# Patient Record
Sex: Female | Born: 1975 | Race: Black or African American | Hispanic: No | Marital: Married | Smoking: Never smoker
Health system: Southern US, Community
[De-identification: ages and names within clinical notes are randomized; demographics above are authoritative.]

## PROBLEM LIST (undated history)

## (undated) DIAGNOSIS — E0590 Thyrotoxicosis, unspecified without thyrotoxic crisis or storm: Secondary | ICD-10-CM

## (undated) DIAGNOSIS — I10 Essential (primary) hypertension: Secondary | ICD-10-CM

## (undated) HISTORY — PX: OVARIAN CYST REMOVAL: SHX89

## (undated) HISTORY — PX: TOOTH EXTRACTION: SUR596

## (undated) HISTORY — DX: Essential (primary) hypertension: I10

---

## 1998-05-05 ENCOUNTER — Emergency Department (HOSPITAL_COMMUNITY): Admission: EM | Admit: 1998-05-05 | Discharge: 1998-05-05 | Payer: Self-pay | Admitting: Emergency Medicine

## 1998-11-08 ENCOUNTER — Other Ambulatory Visit: Admission: RE | Admit: 1998-11-08 | Discharge: 1998-11-08 | Payer: Self-pay | Admitting: *Deleted

## 1999-04-22 ENCOUNTER — Emergency Department (HOSPITAL_COMMUNITY): Admission: EM | Admit: 1999-04-22 | Discharge: 1999-04-22 | Payer: Self-pay | Admitting: Emergency Medicine

## 1999-11-17 ENCOUNTER — Other Ambulatory Visit: Admission: RE | Admit: 1999-11-17 | Discharge: 1999-11-17 | Payer: Self-pay | Admitting: *Deleted

## 2000-03-28 ENCOUNTER — Emergency Department (HOSPITAL_COMMUNITY): Admission: EM | Admit: 2000-03-28 | Discharge: 2000-03-28 | Payer: Self-pay | Admitting: Emergency Medicine

## 2000-09-29 ENCOUNTER — Inpatient Hospital Stay (HOSPITAL_COMMUNITY): Admission: AD | Admit: 2000-09-29 | Discharge: 2000-09-29 | Payer: Self-pay | Admitting: Obstetrics and Gynecology

## 2000-10-16 ENCOUNTER — Other Ambulatory Visit: Admission: RE | Admit: 2000-10-16 | Discharge: 2000-10-16 | Payer: Self-pay | Admitting: *Deleted

## 2001-02-13 ENCOUNTER — Inpatient Hospital Stay (HOSPITAL_COMMUNITY): Admission: AD | Admit: 2001-02-13 | Discharge: 2001-02-22 | Payer: Self-pay | Admitting: Obstetrics and Gynecology

## 2001-02-13 ENCOUNTER — Encounter: Payer: Self-pay | Admitting: Obstetrics and Gynecology

## 2001-02-13 ENCOUNTER — Encounter (INDEPENDENT_AMBULATORY_CARE_PROVIDER_SITE_OTHER): Payer: Self-pay | Admitting: Specialist

## 2001-02-18 ENCOUNTER — Encounter: Payer: Self-pay | Admitting: Obstetrics and Gynecology

## 2001-03-26 ENCOUNTER — Other Ambulatory Visit: Admission: RE | Admit: 2001-03-26 | Discharge: 2001-03-26 | Payer: Self-pay | Admitting: *Deleted

## 2004-03-21 ENCOUNTER — Inpatient Hospital Stay (HOSPITAL_COMMUNITY): Admission: AD | Admit: 2004-03-21 | Discharge: 2004-06-07 | Payer: Self-pay | Admitting: Obstetrics & Gynecology

## 2004-03-22 ENCOUNTER — Ambulatory Visit: Payer: Self-pay | Admitting: Neonatology

## 2004-06-05 ENCOUNTER — Encounter (INDEPENDENT_AMBULATORY_CARE_PROVIDER_SITE_OTHER): Payer: Self-pay | Admitting: *Deleted

## 2004-07-21 ENCOUNTER — Ambulatory Visit (HOSPITAL_COMMUNITY): Admission: RE | Admit: 2004-07-21 | Discharge: 2004-07-21 | Payer: Self-pay | Admitting: Obstetrics & Gynecology

## 2006-06-03 IMAGING — US US OB COMP +14 WK
1 series · 13 of 21 positions shown · non-contrast
Comparison: none

CLINICAL DATA: 24 week 5 day assigned gestational age.  Evaluate fetal growth.

[Series 1: us ob comp +14 wk · 0.29mm/px · 13 of 21 slices shown]
[im 1/21]
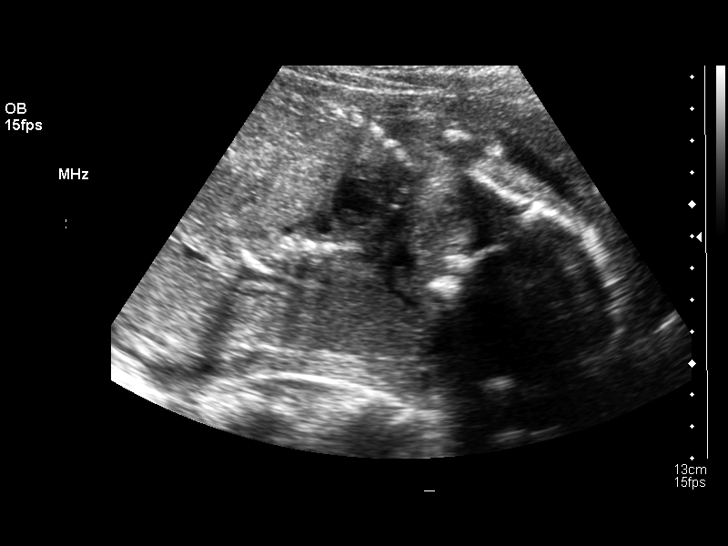
[im 3/21]
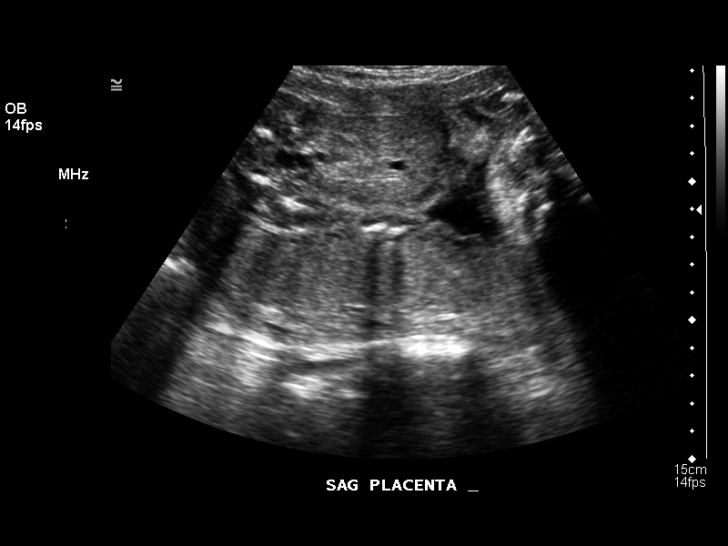
[im 5/21]
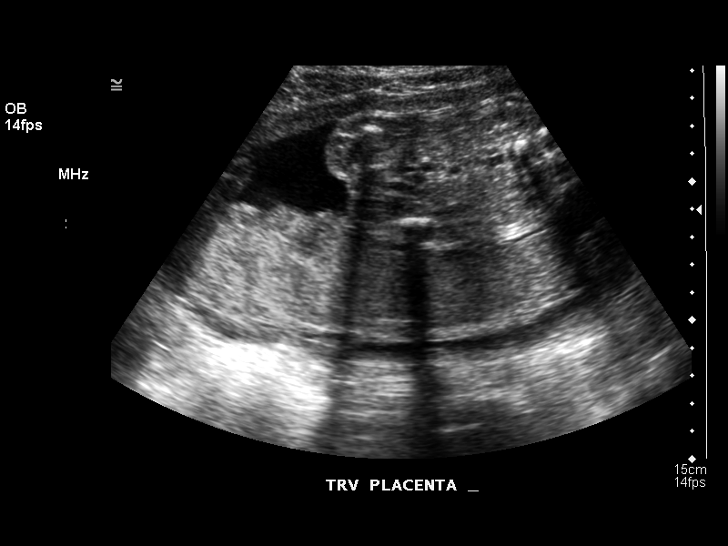
[im 6/21]
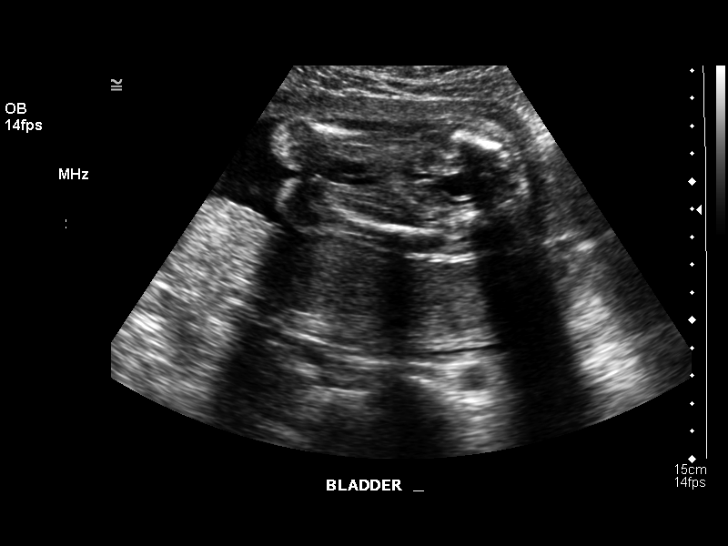
[im 8/21]
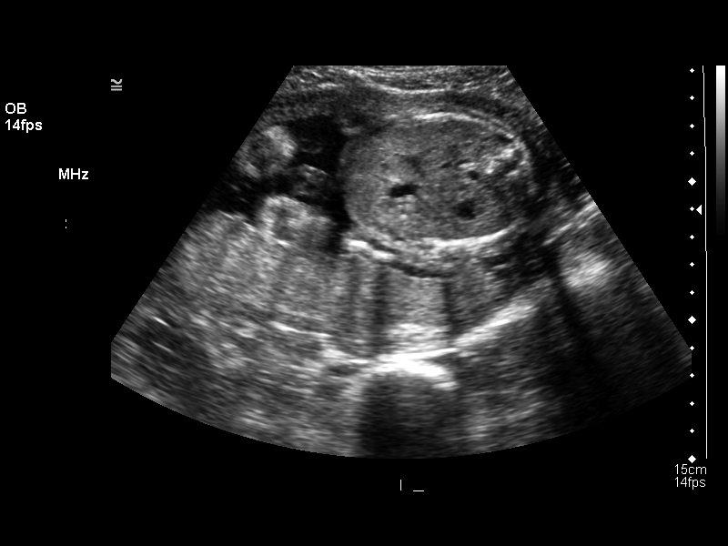
[im 9/21]
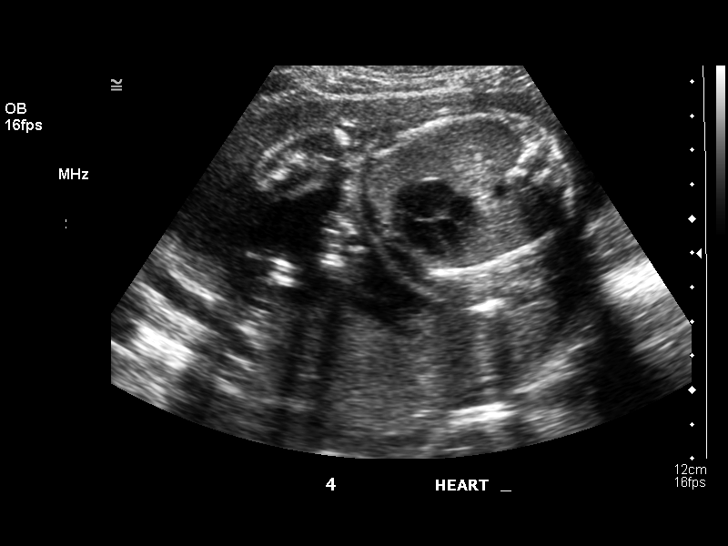
[im 11/21]
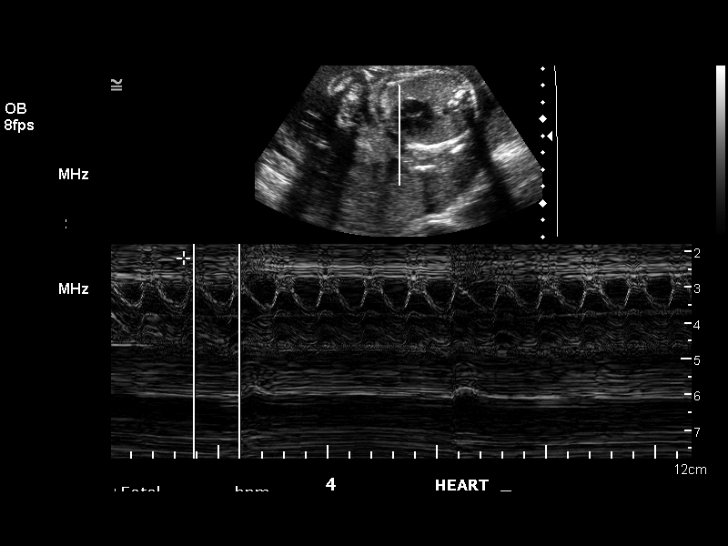
[im 13/21]
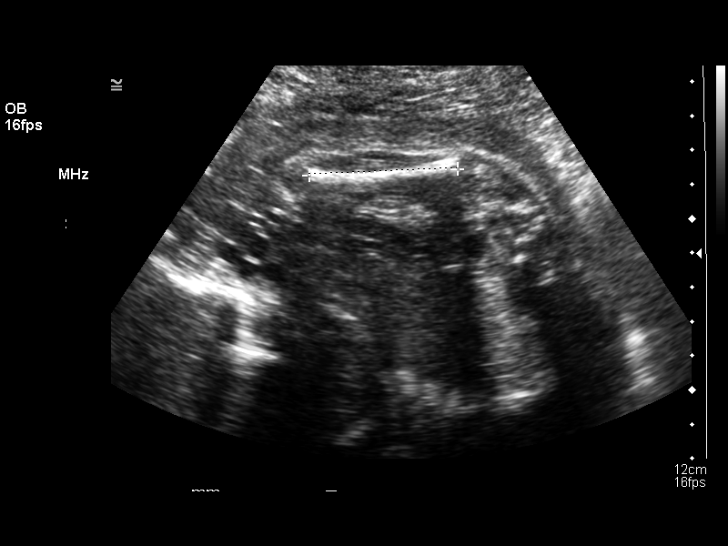
[im 14/21]
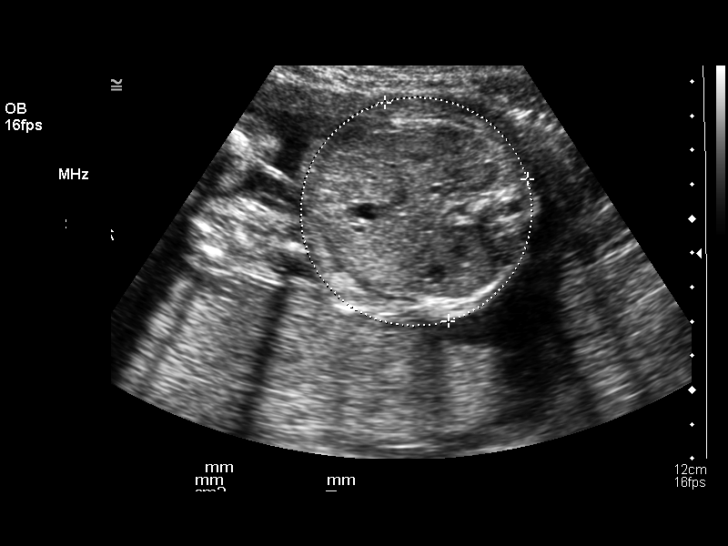
[im 16/21]
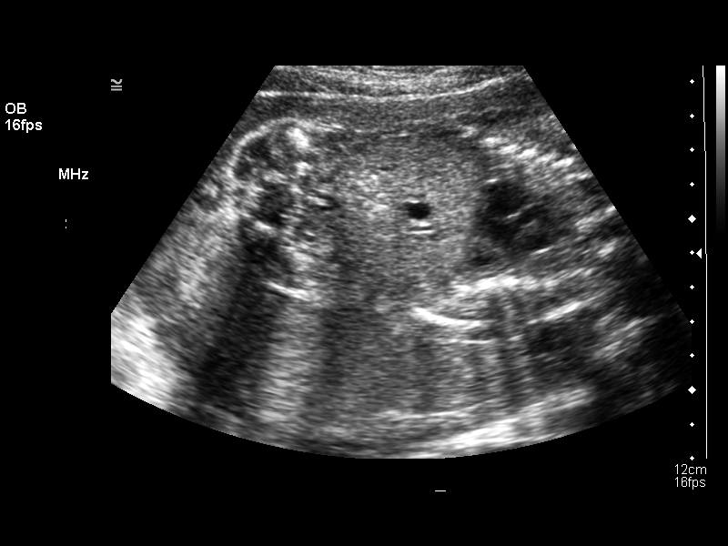
[im 17/21]
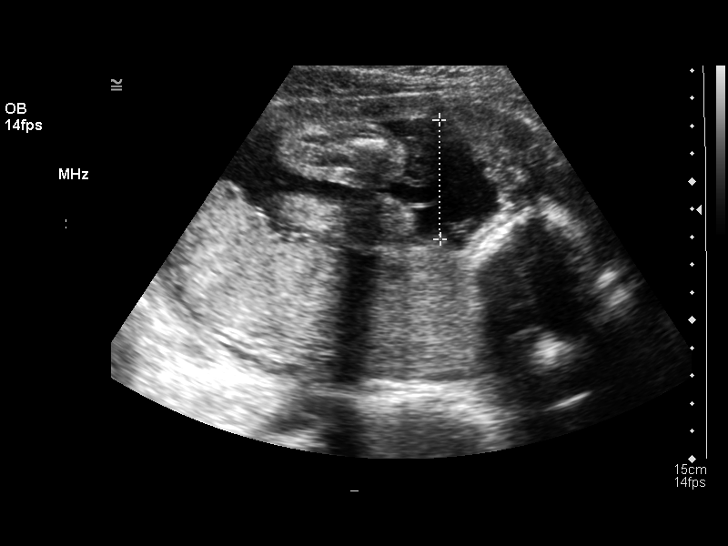
[im 19/21]
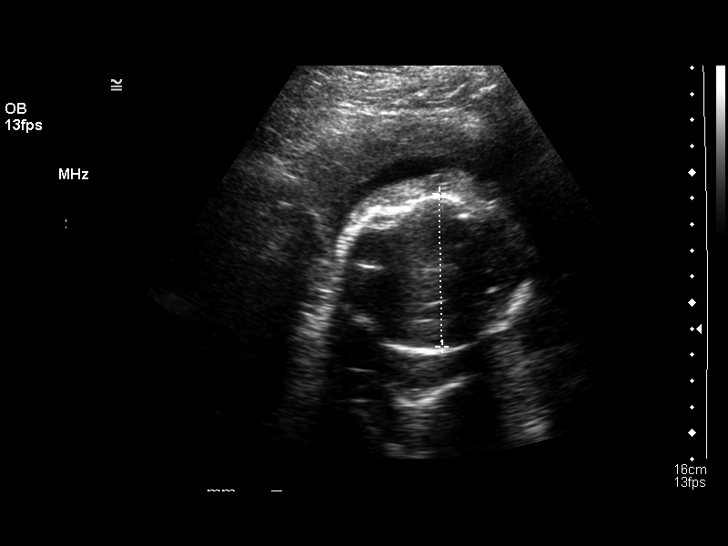
[im 21/21]
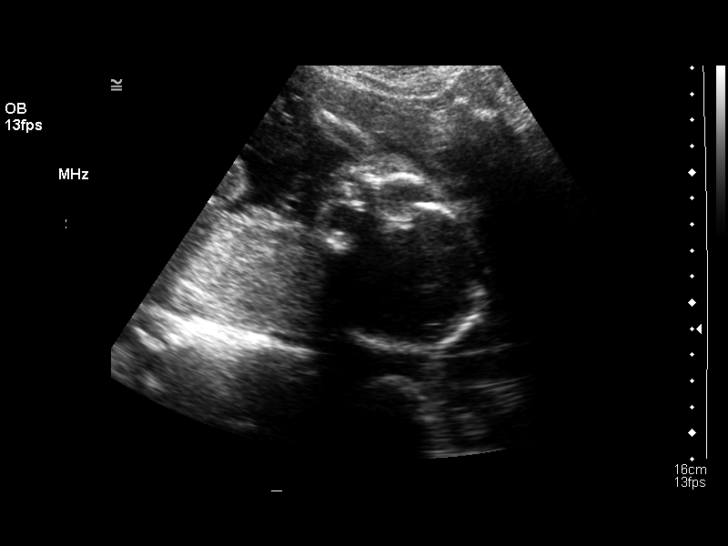

[13 of 21 positions shown; findings below may reference images not displayed]

OBSTETRICAL ULTRASOUND:
Number of Fetuses:  1
Heart Rate:  144
Movement:  Yes
Breathing:  No  
Presentation:  Cephalic
Placental Location:  Posterior
Grade:  I
Previa:  No
Amniotic Fluid (Subjective):  Normal
Amniotic Fluid (Objective):   4.3 cm Vertical pocket 

FETAL BIOMETRY
BPD:  5.9 cm   24 w 0 d
HC:  21.7 cm   23 w 5 d
AC:  20.9 cm   25 w 4 d
FL:    4.3 cm   24 w 1 d

MEAN GA:  24 w 3 d

EFW:  729 g (H) 50th ? 75th%ile (660 ? 772 g) For 25 wks

FETAL ANATOMY
Lateral Ventricles:    Previously seen 
Thalami/CSP:      Previously seen 
Posterior Fossa:  Previously seen 
Nuchal Region:    N/A
Spine:      Not visualized 
4 Chamber Heart on Left:      Visualized 
Stomach on Left:      Visualized 
3 Vessel Cord:    Not visualized 
Cord Insertion site:    Not visualized 
Kidneys:  Visualized 
Bladder:  Visualized 
Extremities:      Not visualized 
Evaluation limited by:  Fetal position.

MATERNAL UTERINE AND ADNEXAL FINDINGS
Cervix:   Not evaluated
IMPRESSION: 1.  Assigned gestational age by prior ultrasound is currently 24 weeks 5 days.  Appropriate fetal growth, with EFW at 50th ? 75th percentile.
2.  Limited fetal anatomic evaluation, however visualized fetal anatomy is unremarkable.  
3.  Normal amniotic fluid volume. 

</u12:p>

## 2006-06-15 IMAGING — US US OB LIMITED
1 series · 14 of 28 positions shown · non-contrast
Comparison: none

CLINICAL DATA: Pregnant [REDACTED] weeks by prior ultrasound.   Please evaluate amniotic fluid volume, cervix and presentation.

[Series 1: us ob limited · 0.29mm/px · 14 of 31 slices shown]
[im 2/31]
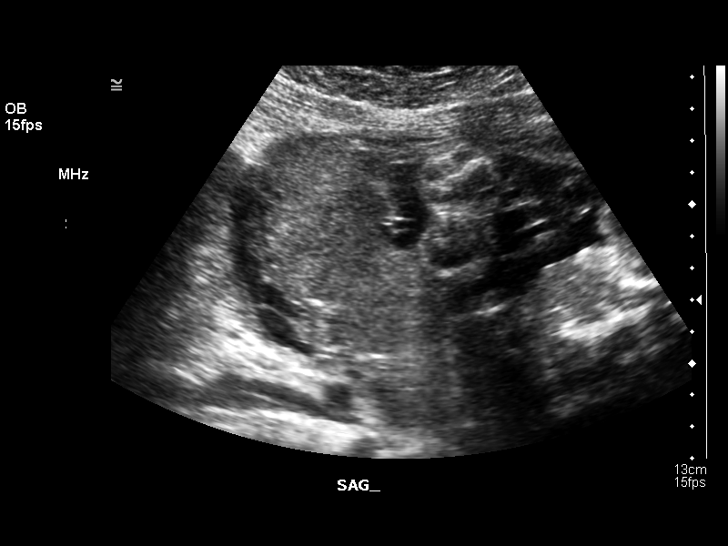
[im 4/31]
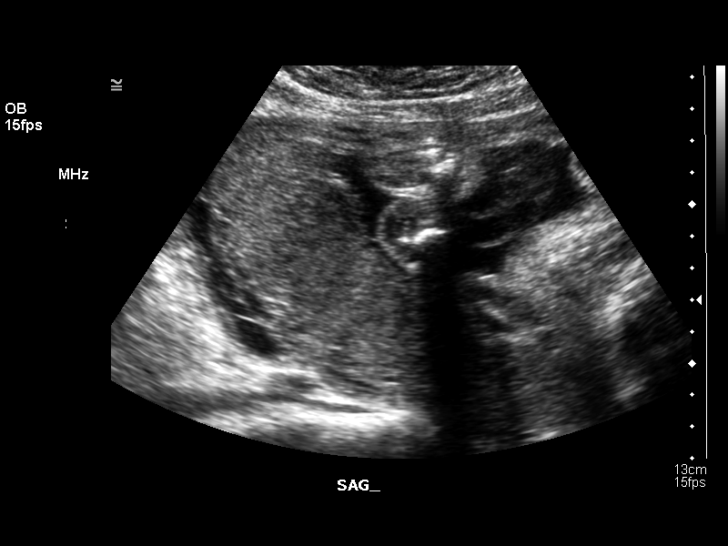
[im 6/31]
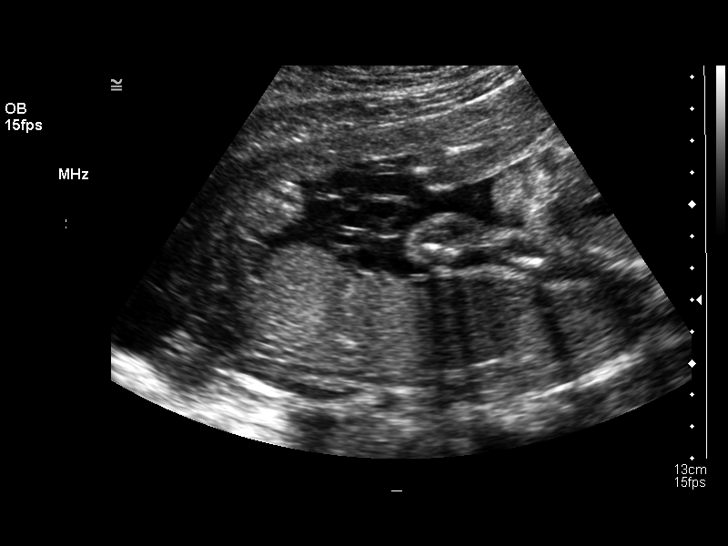
[im 8/31]
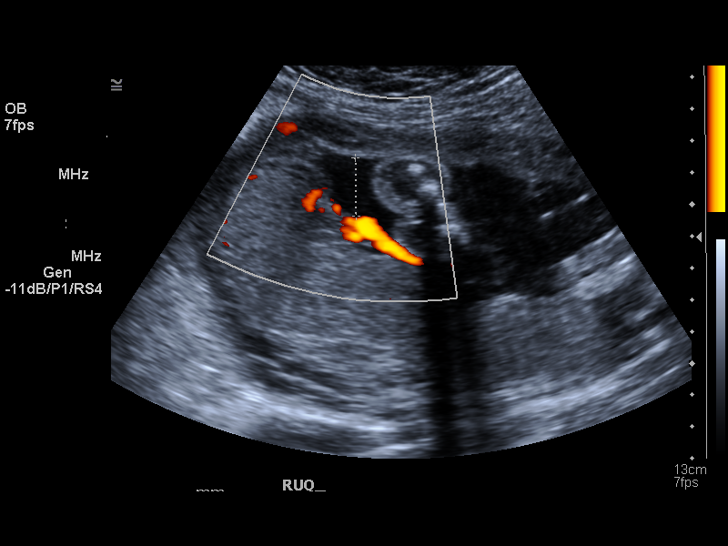
[im 11/31]
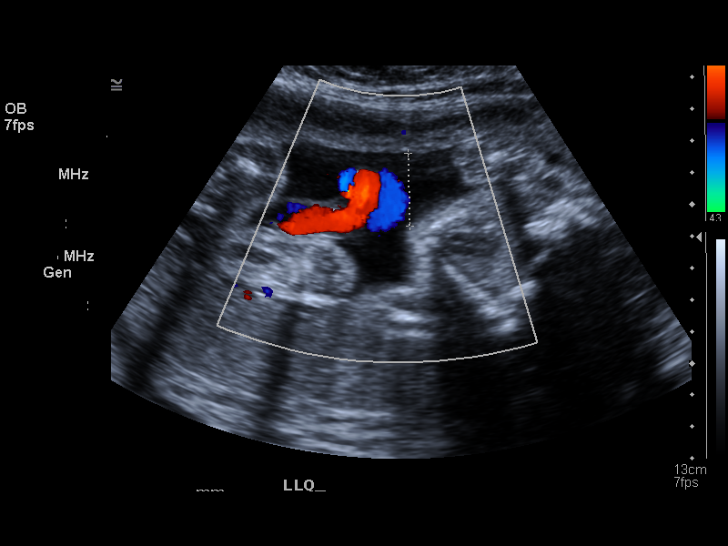
[im 13/31]
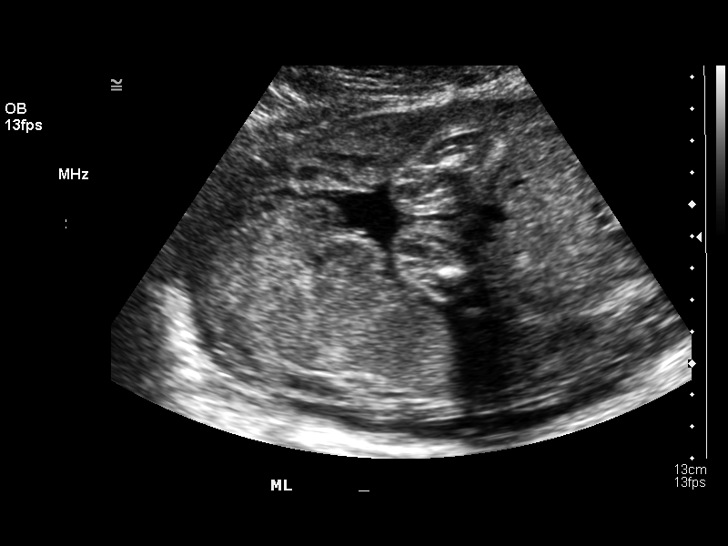
[im 15/31]
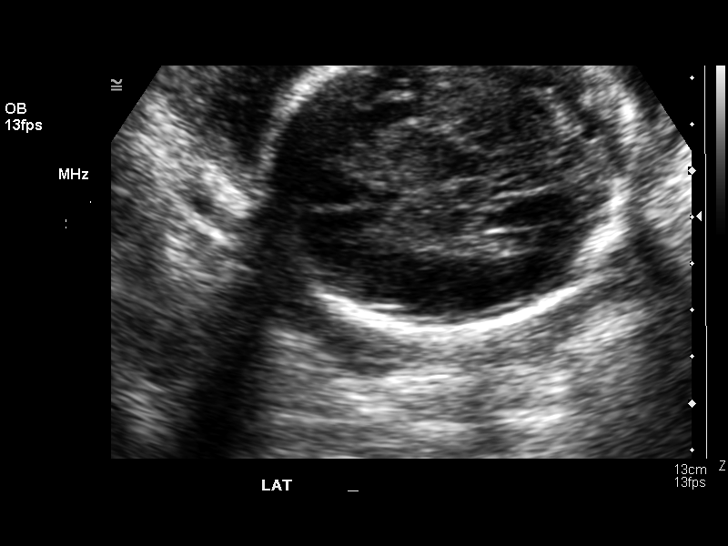
[im 17/31]
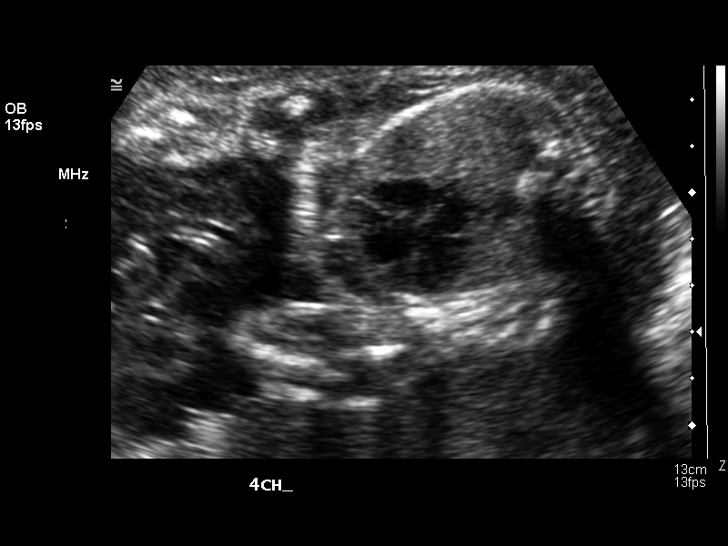
[im 19/31]
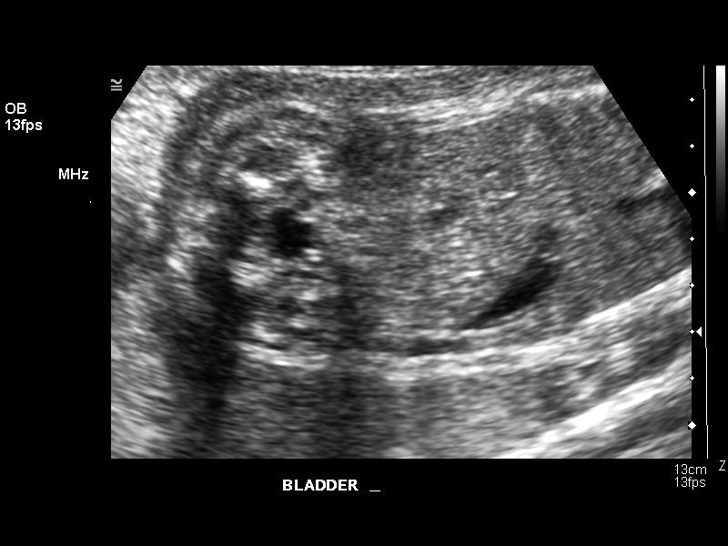
[im 22/31]
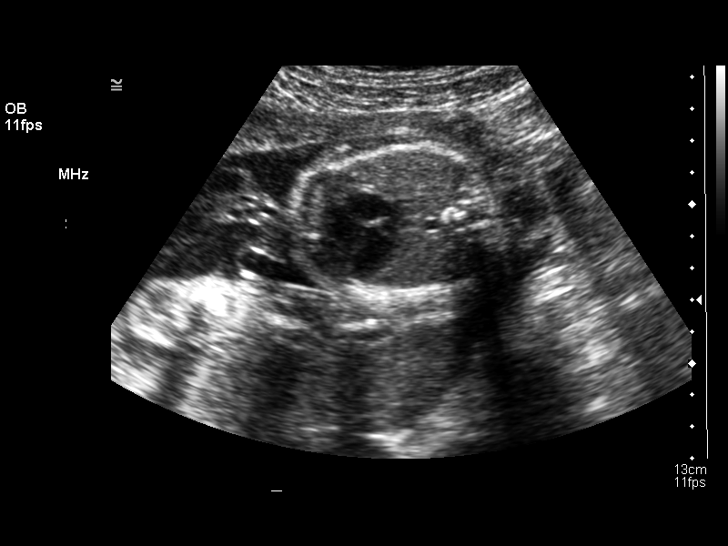
[im 24/31]
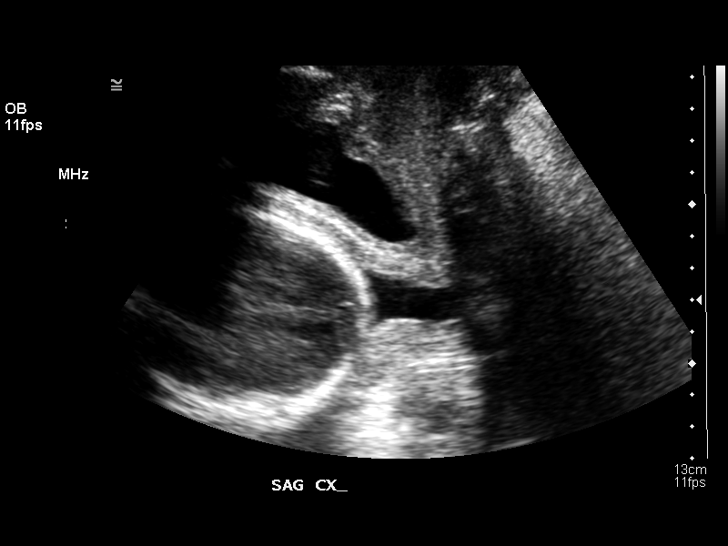
[im 26/31]
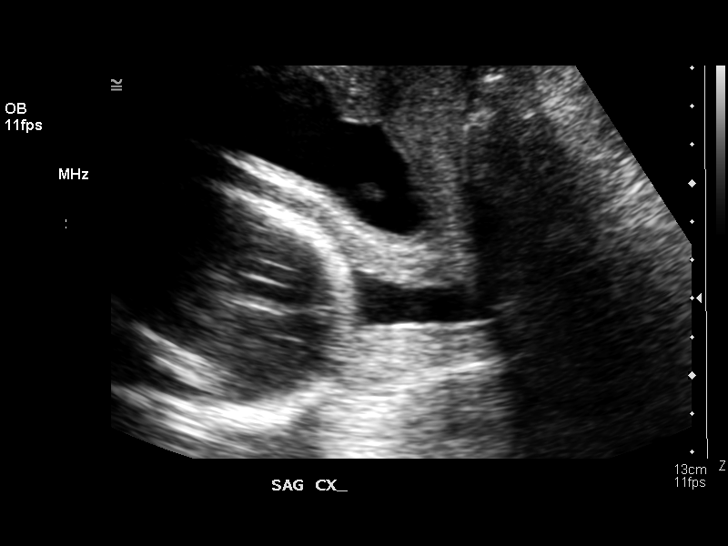
[im 28/31]
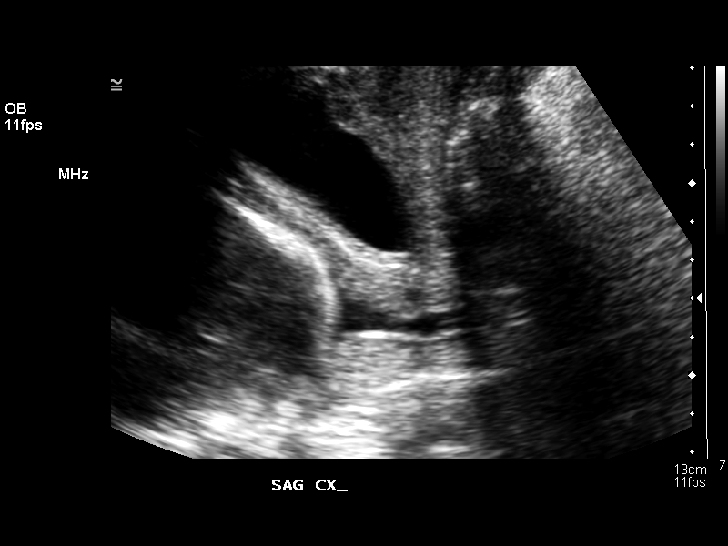
[im 31/31]
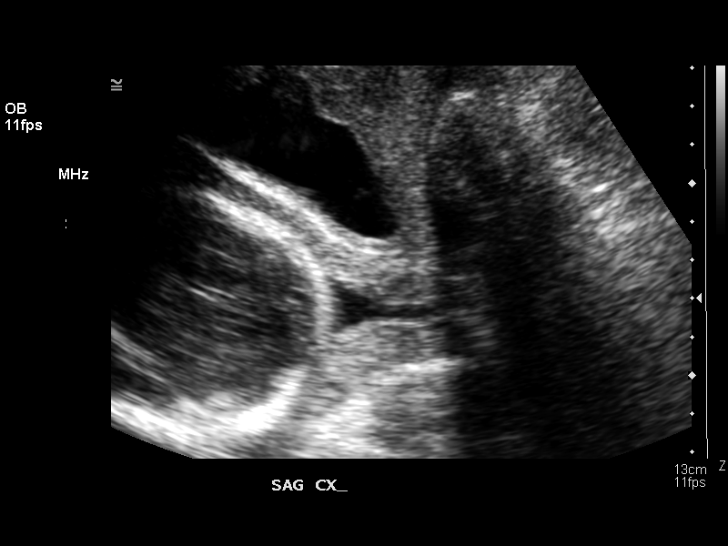

[14 of 28 positions shown; findings below may reference images not displayed]

LIMITED OBSTETRICAL ULTRASOUND:
 Number of Fetuses:  Single
 Heart Rate:  Not requested
 Movement:  Not requested
 Breathing:  Not requested
 Presentation:  Not requested
 Placental Location:  Posterior
 Grade:  I
 Previa:  No
 Amniotic Fluid (Subjective):  Decreased slightly
 Amniotic Fluid (Objective):  5.5 cm for 26 weeks 

 Fetal measurements and complete anatomic evaluation were not requested.  The following fetal anatomy was visualized during this exam:

 MATERNAL UTERINE AND ADNEXAL FINDINGS
 Cervix:  No measurable cervix
IMPRESSION: Single living intrauterine gestation currently in cephalic presentation.   The amniotic fluid index is slightly decreased (5.5 cm).  There is no measurable cervix with the internal cervical os measuring 1.5 cm in diameter and external cervical os measuring 1 cm in diameter.

## 2006-07-17 IMAGING — US US OB FOLLOW-UP
1 series · 13 of 28 positions shown · non-contrast
Comparison: none

CLINICAL DATA: Assess growth.  Cervical changes.

[Series 1: us ob follow-up · 0.35mm/px · 13 of 57 slices shown]
[im 3/57]
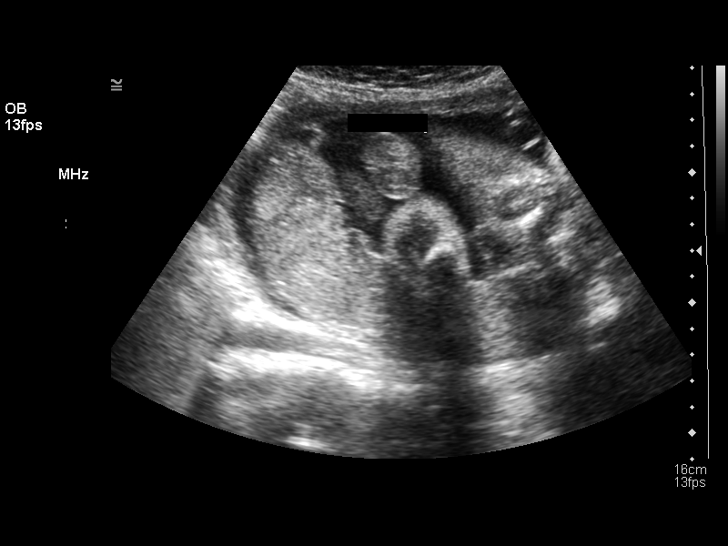
[im 7/57]
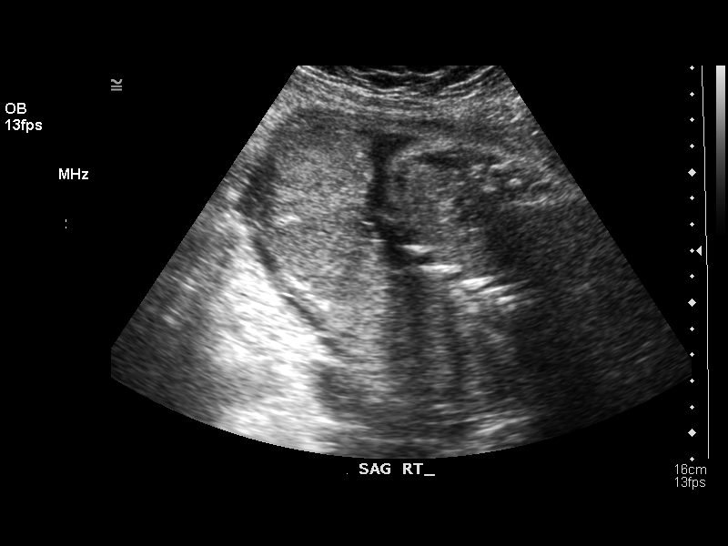
[im 11/57]
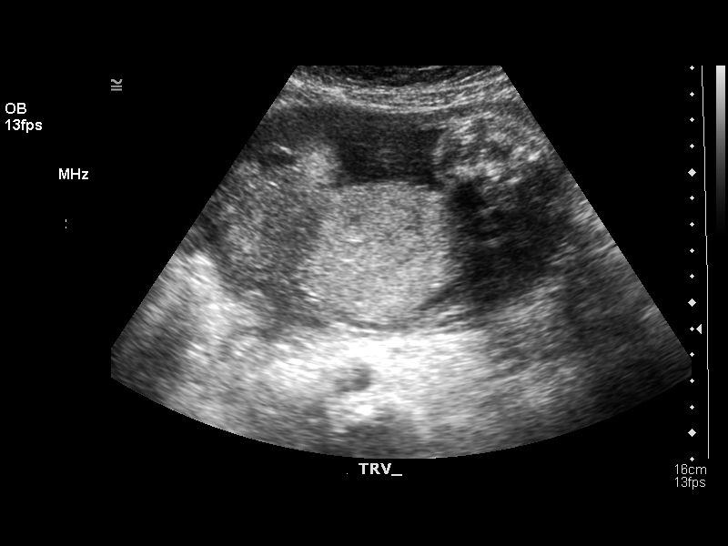
[im 15/57]
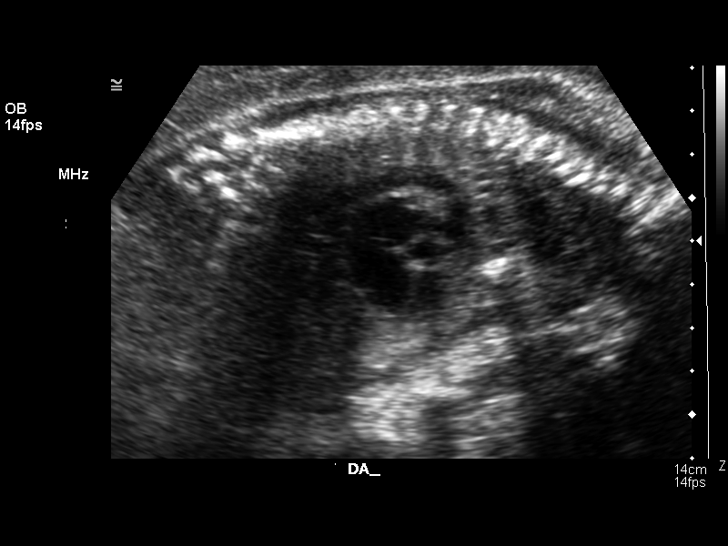
[im 19/57]
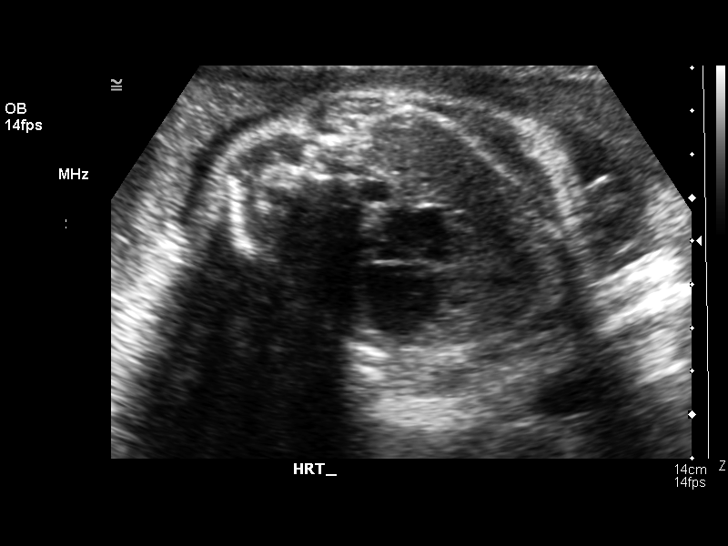
[im 23/57]
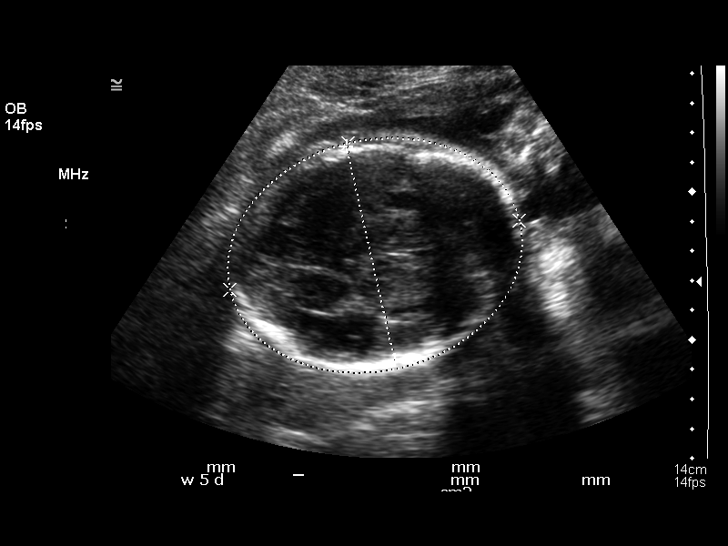
[im 30/57]
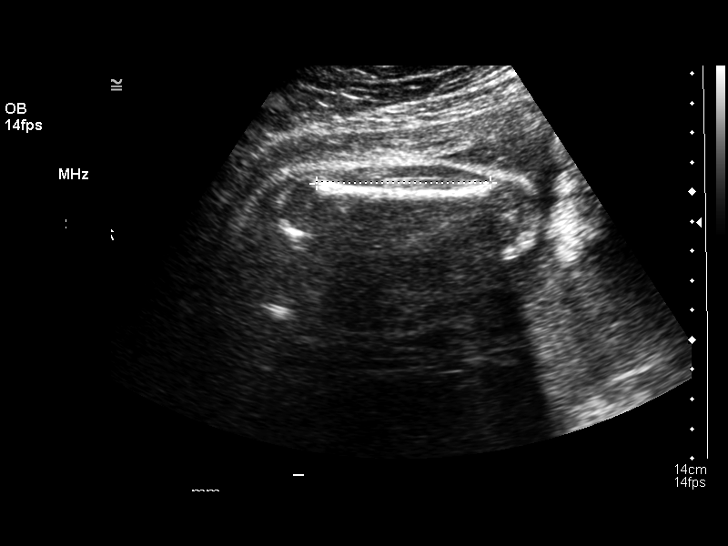
[im 34/57]
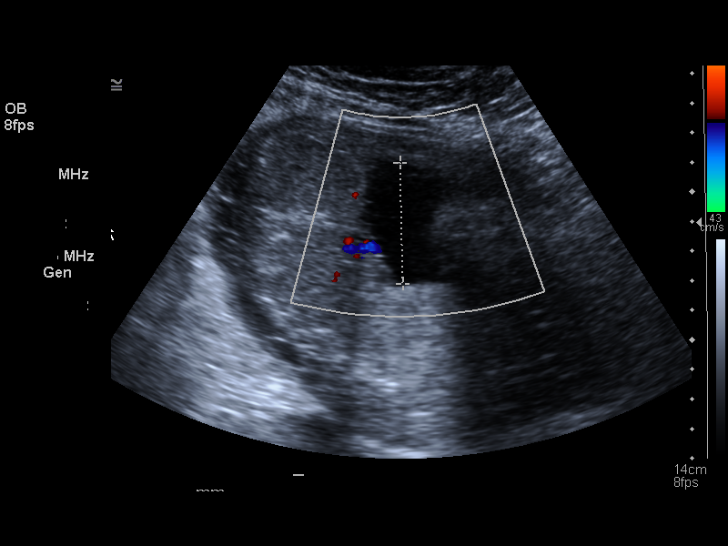
[im 38/57]
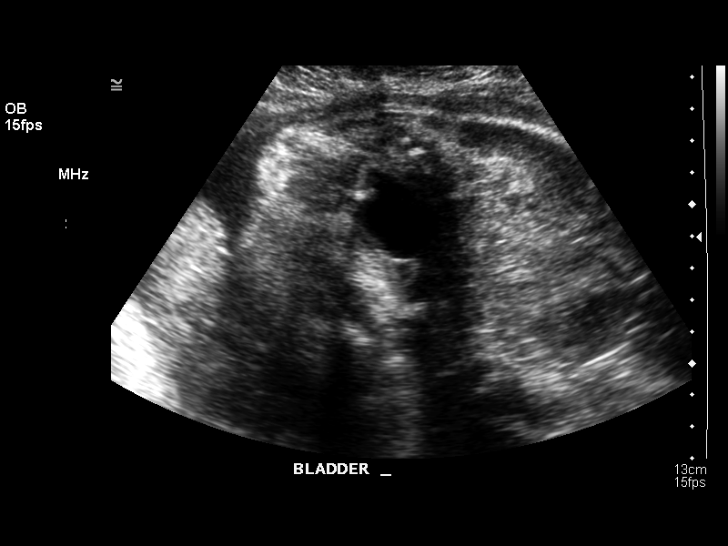
[im 42/57]
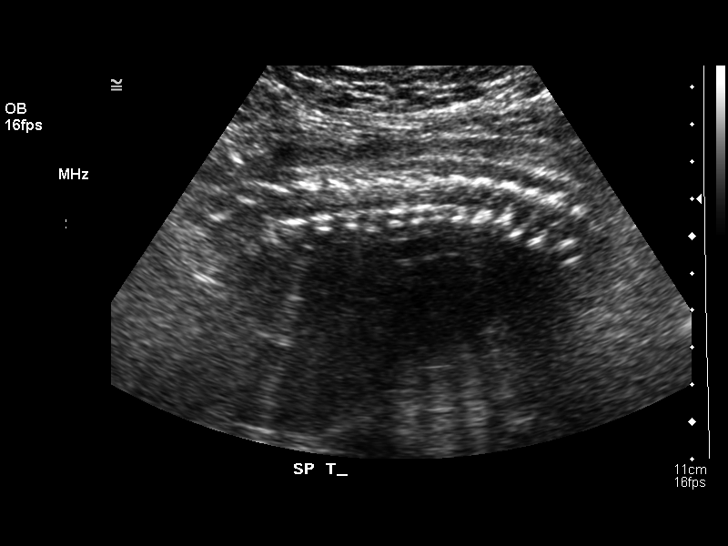
[im 46/57]
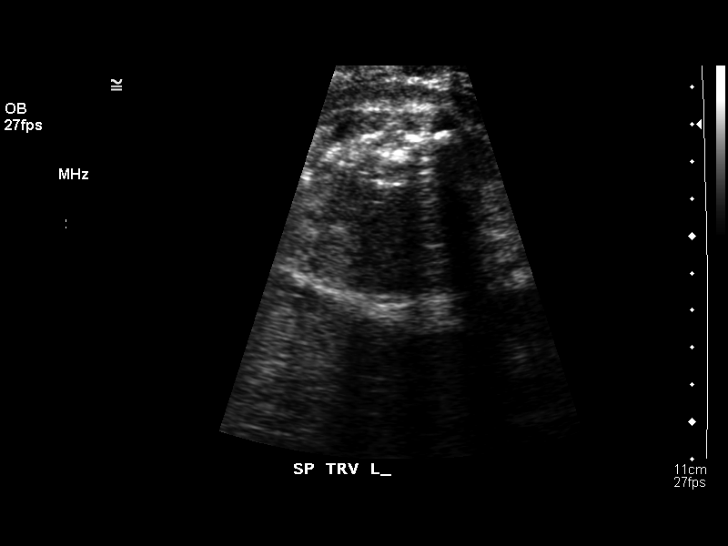
[im 50/57]
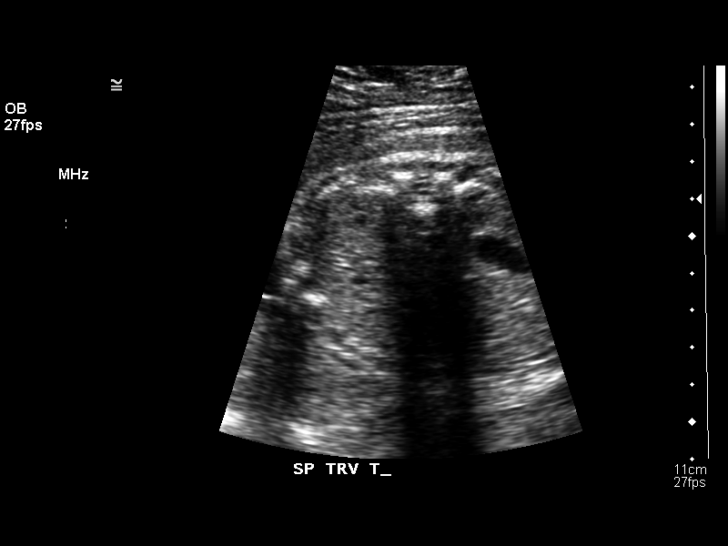
[im 54/57]
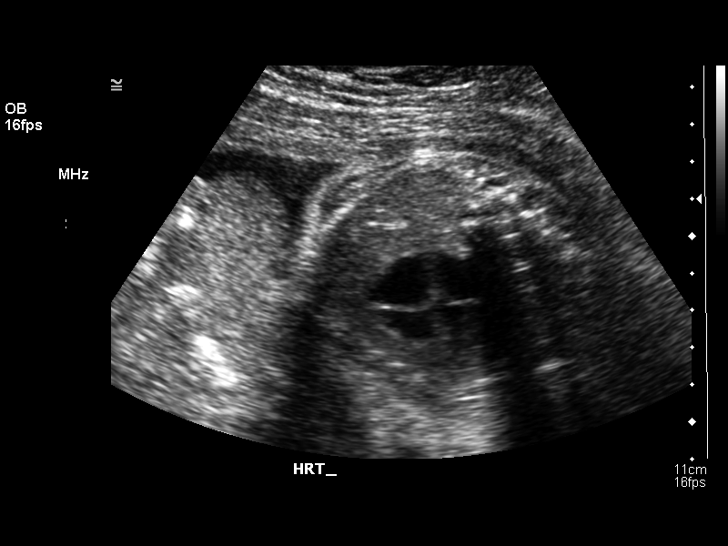

[13 of 28 positions shown; findings below may reference images not displayed]

OBSTETRICAL ULTRASOUND RE-EVALUATION:
Number of Fetuses:  1
Heart Rate:  144
Movement:  Yes
Breathing:  Yes
Presentation:  Cephalic
Placental Location:  Posterior
Grade:  II
Previa:  No
Amniotic Fluid (subjective):  Normal
Amniotic Fluid (objective):  13.0 cm AFI (5th -95th%ile = 8.8 ? 23.8 cm for 31 wks)

FETAL BIOMETRY
BPD:  7.5 cm   30 w 1 d
HC:  27.7 cm   30 w 3 d
AC:  25.9 cm   30 w 1 d
FL:  5.9 cm   30 w 6 d

Mean GA:  30 w 3 d
Assigned GA:  31 w 0 d
Fetal indices are within normal limits.
EFW:  2449 g (H) 50th ? 75th%ile (5903 ? 4041 g) For 31 wks

FETAL ANATOMY
Lateral Ventricles:  Visualized 
Thalami/CSP:  Visualized 
Posterior Fossa:  Visualized 
Nuchal Region:  N/A
Spine:  Visualized 
4 Chamber Heart on Left:  Visualized 
Stomach on Left:  Visualized 
3 Vessel Cord:  Not visualized 
Cord Insertion Site:  Not visualized 
Kidneys:  Visualized 
Bladder:  Visualized 
Extremities:  Not visualized 

ADDITIONAL ANATOMY VISUALIZED:  RVOT, ductal arch, aortic arch, and male genitalia.

MATERNAL UTERINE AND ADNEXAL FINDINGS
Cervix:  3.3 cm Transabdominally
IMPRESSION: 1.  Single intrauterine pregnancy demonstrating an estimated gestational age by ultrasound of 30 weeks and 3 days.  Correlation with assigned gestational age by initial ultrasound of 31 weeks and 0 days suggests appropriate growth.  Currently the estimated fetal weight is between the 50th and 75th percentile for a 31 week gestation.  On the prior exam the estimated fetal weight was just below the 50th percentile for a 28 week gestation suggesting appropriate linear growth.
2.  Subjectively and quantitatively normal amniotic fluid volume and normal cervical length.
3.  No late developing fetal anatomic abnormalities are identified associated with the lateral ventricles, four chamber heart, stomach, kidneys or bladder.  The spine, RVOT, ductal and aortic arches could be seen today and have not been seen previously.  The anterior abdominal wall, 3-vessel cord, remaining heart anatomy and facial anatomy, as well as the diaphragm and distal extremities have not been seen previously and could not be assessed today.

## 2006-11-09 ENCOUNTER — Ambulatory Visit (HOSPITAL_COMMUNITY): Admission: RE | Admit: 2006-11-09 | Discharge: 2006-11-09 | Payer: Self-pay | Admitting: Obstetrics & Gynecology

## 2008-01-19 ENCOUNTER — Emergency Department (HOSPITAL_COMMUNITY): Admission: EM | Admit: 2008-01-19 | Discharge: 2008-01-19 | Payer: Self-pay | Admitting: Family Medicine

## 2010-06-03 NOTE — Discharge Summary (Signed)
[facility]  Patient:    Kathy [name], Kathy [name] Visit Number: [id number] MRN: [id number]          Service Type: OBS Location: 910A [id number] [id number] Attending Physician:  Trevor Iha Dictated by:   Danie Chandler, R.N. Admit Date:  02/13/2001 Discharge Date: 02/22/2001                             Discharge Summary  ADMISSION DIAGNOSES:          1. Intrauterine pregnancy at 31-1/[redacted] weeks                                  gestation.                               2. Premature rupture of membranes.  DISCHARGE DIAGNOSES:          1. Intrauterine pregnancy at 32 weeks.                               2. Spontaneous vaginal delivery of a viable                                  female infant on February 19, 2001, at 2304                                  with Apgars of 8 at one minute and 9 at five                                  minutes.  The baby did go to NICU.  PROCEDURE:                    On February 19, 2001, a spontaneous vaginal delivery over an intact perineum of a viable female infant with Apgars of 8 at one minute and 9 at five minutes.  REASON FOR ADMISSION:         The patient is a 35 year old, gravida 3, para 2, at 31-1/7 weeks who presented with PPROM on February 13, 2001.  In the triage area, the patient did have positive pool, positive fern, and positive nitrazine.  Hemoglobin was 11.3, hematocrit 33.0, white blood cell count 16.5, and platelets 334,000.  An ultrasound showed the baby to be in the vertex presentation with an AFI of 11.6 and estimated fetal weight 1944 grams which was 76th percentile.  The patients vital signs were stable and she was afebrile. Fetal heart rate was in the 140s with accellerations.  The patient was having contractions.  Her cervix was 3 cm, 80%, and -3 station.  HOSPITAL COURSE:              The patient was admitted and placed on IV antibiotics in the form of Unasyn. She also received betamethasone.  She was also started  on magnesium sulfate for labor and advanced cervical dilation. She was also placed on bed rest and had a NICU consult.  The patient remained under constant fetal surveillance and received expectant management  and on February 19, 2001, the patient spontaneously delivered over intact perineum a viable female infant with Apgars of 8 at one minute and 9 at five minutes.  On postpartum day #1, the patients vital signs were stable and she was afebrile. Fundus was firm and lochia was within normal limits.  Her hemoglobin was 10.3, hematocrit 30.0, and white blood cell count 24.3.  On postpartum day #2 the patient remained without complaint and she was discharged home on postpartum day #3.  CONDITION ON DISCHARGE:       Good.  DIET:                         Regular as tolerated.  ACTIVITY:                     No heavy lifting, no driving, no vaginal entry.  FOLLOW-UP:                    She is to follow up in the office for a postpartum examination in four to six weeks.  She is to call for a temperature greater than 100 degrees, persistent nausea and vomiting, heavy vaginal bleeding.  DISCHARGE MEDICATIONS:        1. Prenatal vitamins one p.o. q.d.                               2. Tylox as directed by M.D. Dictated by:   Danie Chandler, R.N. Attending Physician:  Trevor Iha DD:  03/06/01 TD:  03/06/01 Job: [id number]

## 2010-06-03 NOTE — Discharge Summary (Signed)
[name], [name] NO.:  [id number]   MEDICAL RECORD NO.:  [id number]          PATIENT TYPE:  INP   LOCATION:  9106                          FACILITY:  [facility]   PHYSICIAN:  Charles A. Clearance Coots, M.D.DATE OF BIRTH:  06-03-1975   DATE OF ADMISSION:  03/21/2004  DATE OF DISCHARGE:  06/07/2004                                 DISCHARGE SUMMARY   ADMISSION DIAGNOSES:  1.  24-weeks gestation.  2.  Spontaneous rupture of membranes.  3.  Preterm cervical changes.   DISCHARGE DIAGNOSES:  1.  24-weeks gestation.  2.  Spontaneous rupture of membranes.  3.  Preterm cervical changes.  4.  Status post normal spontaneous vaginal delivery on Jun 05, 2004.  5.  Viable female infant at 0602, Apgars of 8 at one minute and 9 at five      minutes, at 35+ weeks gestation.   CONDITION ON DISCHARGE:  Mother and infant discharged home in good  condition.   REASON FOR ADMISSION:  A 35 year old black female, gravida 4, para 2-1-0-3,  presented at 24-1/[redacted] weeks gestation with vaginal pressure.  The patient  stated that she had increased vaginal pressure and pain for approximately 24  hours prior to her presentation and was worse at the time of her  presentation to triage.  She denied vaginal discharge or bleeding.  She had  a history of being treated for bacterial vaginosis the week prior to her  presentation.   PAST MEDICAL HISTORY:  Significant for preterm delivery at [redacted] weeks  gestation after premature rupture of membranes preterm in 2003.  She had a  normal spontaneous vaginal delivery at [redacted] weeks gestation in 1999 and 1997.   PAST SURGICAL HISTORY:  Diagnostic laparoscopy in 1995.   PAST MEDICAL HISTORY:  Illnesses; none.   MEDICATIONS:  Prenatal vitamins.   ALLERGIES:  No known drug allergies.   SOCIAL HISTORY:  Married.  School [employer], broadcasting/film/video.  Negative tobacco, alcohol, or  recreational drug use.   PHYSICAL EXAMINATION:  VITAL SIGNS:  Temperature 98.3, pulse 102,  respiratory  rate 20, blood pressure 130/80.  ABDOMEN:  Gravid and nontender.  PELVIC:  On sterile speculum examination, cervix was dilated and there was a  fetal foot in the cervical canal that was visible.   ASSESSMENT:  1.  24-weeks gestation.  2.  Preterm cervical changes.   PLAN:  Admit, tocolysis with magnesium sulfate, IV antibiotics therapy.   ADMISSION LABORATORY DATA:  Hemoglobin 10.5, hematocrit 30, white blood cell  count 19,000, platelets 303,000.  Urinalysis was within normal limits.   HOSPITAL COURSE:  The patient was admitted.  Ultrasound was obtained and  revealed amniotic fluid index to be low with amniotic fluid index of around  7.  The patient remained stable as far as contractility on tocolysis and bed  rest and was also given steroids for acceleration of fetal lung maturity.  She complained of some leakage of fluid after 48 hours of hospitalization  and the sterile speculum examination was performed and positive ferning was  observed on slide examination.  Preterm premature rupture of membranes was  therefore confirmed.  The patient was placed on broad antibiotic coverage  for preterm premature rupture of membranes and tocolysis was discontinued.  The patient was placed on expectant management.  She continued to do well on  expectant management and follow-up ultrasounds revealed stable amniotic  fluid index of between 7 and 13 cm.  The baby's growth was appropriate  during the hospital course and she did not develop any evidence of  infection.  At [redacted] weeks gestation, the patient noted the onset of increased  uterine activity with increased leaking and increased uterine contractions.  On Jun 05, 2004, at 35-1/[redacted] weeks gestation, the patient noted increased  vaginal pressure and on examination the cervix was noted to be 6 cm dilated,  100% effaced, and vertex at 0 station.  She progressed to full dilatation  and normal spontaneous vaginal delivery without complications.  A  neonatal  intensive care team was at delivery, but no intervention was needed and the  infant was taken to the regular nursery.  The patient's postpartum course  was uncomplicated and she was discharged home on postpartum day #2 in good  condition.   DISCHARGE LABORATORY DATA:  Hemoglobin 10, hematocrit 29, white blood cell  count 18,000, platelets 252,000.   DISCHARGE MEDICATIONS:  1.  Ibuprofen prescribed for pain.  2.  Continued prenatal vitamins.   DISCHARGE INSTRUCTIONS:  Routine written instructions were given for  obstetrical discharge after vaginal delivery.  The patient is to call the  office for a follow-up appointment in 2 weeks.       CAH/MEDQ  D:  06/28/2004  T:  06/28/2004  Job:  [id number]

## 2010-06-03 NOTE — Op Note (Signed)
NAME:  Kathy [name], Kathy [name] NO.:  [id number]   MEDICAL RECORD NO.:  [id number]          PATIENT TYPE:  AMB   LOCATION:  SDC                           FACILITY:  [facility]   PHYSICIAN:  Roseanna Rainbow, M.D.DATE OF BIRTH:  1975/09/06   DATE OF PROCEDURE:  07/21/2004  DATE OF DISCHARGE:                                 OPERATIVE REPORT   PREOPERATIVE DIAGNOSIS:  Multiparity, desires sterilization procedure.   POSTOPERATIVE DIAGNOSIS:  Multiparity, desires sterilization procedure.   PROCEDURE:  Laparoscopic bilateral tubal ligation with bipolar cautery.   SURGEON:  Roseanna Rainbow, M.D.   ANESTHESIA:  General tracheal.   IV FLUIDS:  As per anesthesiology.   URINE OUTPUT:  50 cc at the beginning of the procedure.   ESTIMATED BLOOD LOSS:  Minimal.   DESCRIPTION OF PROCEDURE:  The patient was taken to the operating room with  an IV running. She was placed in the dorsal lithotomy position and given  general anesthesia and prepped and draped in usual sterile fashion. A Graves  speculum was placed in the patient's vagina. A single-tooth tenaculum was  applied to the anterior lip of the cervix. The Hulka manipulator was then  advanced into the uterus and secured to the anterior lip of the cervix as a  means to manipulate the uterus. The single-tooth tenaculum and speculum were  then removed. A periumbilical incision was then made with the scalpel and  carried down to the fascia sharply. The fascia was then tented up and  entered sharply with curved Mayo scissors. The parietal peritoneum was  tented up and entered sharply with Metzenbaum scissors. The incision was  noted to be free of adhesions. The Hassan trocar and sleeve were then  advanced into the abdomen. Intra-abdominal placement was confirmed with the  laparoscope. The abdomen was then insufflated with CO2 gas. A survey of the  pelvic viscera was normal. The mid isthmic portion of the left fallopian  tube was  then cauterized continuously. With each application, the ohmmeter  was noted to go to zero. The right fallopian tube was then manipulated in a  similar fashion. All instruments were then removed from the abdomen. The  fascia was reapproximated with 0 Vicryl. The skin was closed with  interrupted sutures of 3-0 Vicryl and Dermabond. The Hulka manipulator was  removed from the vagina with minimal bleeding noted from the cervix. At the  close of the procedure, the instrument and pack counts were said to be  correct x2. The patient was taken to the PACU awake and in stable condition.     LAJ/MEDQ  D:  07/21/2004  T:  07/21/2004  Job:  [id number]

## 2011-09-09 ENCOUNTER — Other Ambulatory Visit: Payer: Self-pay | Admitting: Obstetrics

## 2012-12-26 ENCOUNTER — Ambulatory Visit (INDEPENDENT_AMBULATORY_CARE_PROVIDER_SITE_OTHER): Payer: BC Managed Care – PPO | Admitting: Obstetrics

## 2012-12-26 ENCOUNTER — Encounter: Payer: Self-pay | Admitting: Obstetrics

## 2012-12-26 VITALS — BP 126/88 | HR 101 | Temp 98.6°F | Ht 66.0 in | Wt 164.0 lb

## 2012-12-26 DIAGNOSIS — N760 Acute vaginitis: Secondary | ICD-10-CM

## 2012-12-26 DIAGNOSIS — A499 Bacterial infection, unspecified: Secondary | ICD-10-CM

## 2012-12-26 DIAGNOSIS — N951 Menopausal and female climacteric states: Secondary | ICD-10-CM

## 2012-12-26 DIAGNOSIS — N949 Unspecified condition associated with female genital organs and menstrual cycle: Secondary | ICD-10-CM

## 2012-12-26 DIAGNOSIS — B9689 Other specified bacterial agents as the cause of diseases classified elsewhere: Secondary | ICD-10-CM

## 2012-12-26 DIAGNOSIS — N898 Other specified noninflammatory disorders of vagina: Secondary | ICD-10-CM

## 2012-12-26 MED ORDER — METRONIDAZOLE 500 MG PO TABS: 500.0000 mg | ORAL_TABLET | Freq: Two times a day (BID) | ORAL | Status: DC

## 2012-12-26 MED ORDER — VENLAFAXINE HCL ER 75 MG PO CP24: 75.0000 mg | ORAL_CAPSULE | Freq: Every day | ORAL | Status: DC

## 2012-12-26 NOTE — Progress Notes (Signed)
Subjective:     Kathy [name] is a 37 y.o. female here for a problem exam.  Current complaints: complains of frequent hot flashes, reports vaginal odor, noticed pinkish discharge 4 days ago, not having noticeable at this time.  Personal health questionnaire reviewed: yes.   Gynecologic History Patient's last menstrual period was 12/02/2012. Contraception: tubal ligation Last Pap: 01/2012. Results were: normal Last mammogram: N/A  Obstetric History OB History  No data available     The following portions of the patient's history were reviewed and updated as appropriate: allergies, current medications, past family history, past medical history, past social history, past surgical history and problem list.  Review of Systems Pertinent items are noted in HPI.    Objective:    General appearance: alert and no distress Abdomen: normal findings: soft, non-tender Pelvic: cervix normal in appearance, external genitalia normal, no adnexal masses or tenderness, no cervical motion tenderness, rectovaginal septum normal, uterus normal size, shape, and consistency and vagina normal without discharge    Assessment:    Vasomotor symptoms  BV   Plan:    Education reviewed: Management of hot flashes.  Management of BV.. Follow up in: several months. Effexor Rx Flagyl Rx

## 2012-12-27 ENCOUNTER — Encounter: Payer: Self-pay | Admitting: Obstetrics

## 2012-12-27 DIAGNOSIS — B9689 Other specified bacterial agents as the cause of diseases classified elsewhere: Secondary | ICD-10-CM | POA: Insufficient documentation

## 2012-12-27 DIAGNOSIS — N951 Menopausal and female climacteric states: Secondary | ICD-10-CM | POA: Insufficient documentation

## 2012-12-27 DIAGNOSIS — N760 Acute vaginitis: Secondary | ICD-10-CM | POA: Insufficient documentation

## 2012-12-27 DIAGNOSIS — N898 Other specified noninflammatory disorders of vagina: Secondary | ICD-10-CM | POA: Insufficient documentation

## 2012-12-27 LAB — GC/CHLAMYDIA PROBE AMP: GC Probe RNA: NEGATIVE

## 2013-01-17 ENCOUNTER — Other Ambulatory Visit: Payer: Self-pay | Admitting: *Deleted

## 2013-01-20 ENCOUNTER — Encounter: Payer: Self-pay | Admitting: Obstetrics

## 2013-07-10 ENCOUNTER — Encounter: Payer: Self-pay | Admitting: Internal Medicine

## 2013-07-10 ENCOUNTER — Other Ambulatory Visit: Payer: Self-pay | Admitting: Internal Medicine

## 2013-07-10 ENCOUNTER — Ambulatory Visit (INDEPENDENT_AMBULATORY_CARE_PROVIDER_SITE_OTHER): Payer: BC Managed Care – PPO | Admitting: Internal Medicine

## 2013-07-10 VITALS — BP 128/72 | HR 116 | Temp 98.2°F | Ht 66.0 in | Wt 154.0 lb

## 2013-07-10 DIAGNOSIS — E0590 Thyrotoxicosis, unspecified without thyrotoxic crisis or storm: Secondary | ICD-10-CM

## 2013-07-10 MED ORDER — ATENOLOL 25 MG PO TABS: 25.0000 mg | ORAL_TABLET | Freq: Every day | ORAL | Status: DC

## 2013-07-10 MED ORDER — METHIMAZOLE 10 MG PO TABS: 10.0000 mg | ORAL_TABLET | Freq: Two times a day (BID) | ORAL | Status: DC

## 2013-07-10 NOTE — Progress Notes (Addendum)
Patient ID: Kathy [name], female   DOB: 09/25/1975, 38 y.o.   MRN: [id number]   HPI  Kathy [name] is a 38 y.o.-year-old female, referred by her PCP, Milus HeightNoelle Redmon, PA, in consultation for thyrotoxycosis.  I reviewed pt's thyroid tests - received from PCP: 07/08/2013: TSH <0.01 (0.34-5.6)  EKG: sinus tachycardia  WBC normal  Pt describes that in the last 2 mo, she noticed: - no fatigue - + palpitations in last few mo - + tremors - + rapid heart beat - + weight loss (10 lbs in last 6 mo, previously 14 lbs intentionally before) - + lack of appetite - + heat intolerance and sweating - after her last pregnancy in 2006 - + constipation - as usual - no dry skin - + hair falling - as usual - + anxiety, no depression  Pt denies feeling nodules in neck, hoarseness, dysphagia/odynophagia, but has SOB.  No FH of thyroid disorders (does not know about father's side of the family). No FH of thyroid cancer.  No h/o radiation tx to head or neck. No recent use of iodine supplements.  ROS: Constitutional: see HPI Eyes: no blurry vision, no xerophthalmia ENT: no sore throat, no nodules palpated in throat, no dysphagia/odynophagia, no hoarseness Cardiovascular: no CP/+ SOB/+ palpitations/no leg swelling Respiratory: no cough/+ SOB Gastrointestinal: no N/V/D/+ C Musculoskeletal: no muscle/joint aches Skin: no rashes Neurological: + tremors/no numbness/tingling/dizziness, + HA Psychiatric: no depression/+ anxiety  Past Medical History  Diagnosis Date  . Hypertension    Past Surgical History  Procedure Laterality Date  . Ovarian cyst removal    . Tooth extraction Bilateral    History   Social History  . Marital Status: Married    Spouse Name: N/A    Number of Children: 4   Occupational History  . Educator   Social History Main Topics  . Smoking status: Never Smoker   . Smokeless tobacco: Never Used  . Alcohol Use: No  . Drug Use: No  . Sexual Activity: Yes    Partners:  Male    Birth Control/ Protection: IUD     Comment: tubal ligation   Current Outpatient Prescriptions on File Prior to Visit  Medication Sig Dispense Refill  . Fish Oil-Cholecalciferol (FISH OIL/D3 ADULT GUMMIES PO) Take by mouth.      Marland Kitchen. ibuprofen (ADVIL,MOTRIN) 200 MG tablet Take 600 mg by mouth every 6 (six) hours as needed.      Marland Kitchen. lisinopril (PRINIVIL,ZESTRIL) 10 MG tablet Take 10 mg by mouth daily.      . metroNIDAZOLE (FLAGYL) 500 MG tablet Take 1 tablet (500 mg total) by mouth 2 (two) times daily.  14 tablet  2  . venlafaxine XR (EFFEXOR-XR) 75 MG 24 hr capsule Take 1 capsule (75 mg total) by mouth daily.  30 capsule  11   No current facility-administered medications on file prior to visit.   No Known Allergies Family History  Problem Relation Age of Onset  . Hypertension Mother   . Cancer Mother   . Cancer Father   . Cancer Maternal Grandmother    PE: BP 128/72  Pulse 116  Temp(Src) 98.2 F (36.8 C) (Oral)  Ht 5\' 6"  (1.676 m)  Wt 154 lb (69.854 kg)  BMI 24.87 kg/m2  SpO2 96% Wt Readings from Last 3 Encounters:  07/10/13 154 lb (69.854 kg)  12/26/12 164 lb (74.39 kg)   Constitutional: normal weight, in NAD Eyes: PERRLA, EOMI, no exophthalmos, no lid lag, no stare ENT: moist  mucous membranes, + symmetric thyromegaly, no thyroid bruits, no cervical lymphadenopathy Cardiovascular: tachycardia, RR, No MRG Respiratory: CTA B Gastrointestinal: abdomen soft, NT, ND, BS+ Musculoskeletal: no deformities, strength intact in all 4 Skin: moist, warm, no rashes Neurological: ++ tremor with outstretched hands, DTR normal in all 4  ASSESSMENT:  1. Thyrotoxycosis  PLAN:  1. Patient with a recently found low TSH, with thyrotoxic sxs: weight loss, heat intolerance, palpitations, anxiety.  - she does not appear to have exogenous causes for the low TSH.  - We discussed that possible causes of thyrotoxicosis are:  Graves ds (no thyroid bruits auscultated)   Thyroiditis toxic  multinodular goiter/ toxic adenoma (I cannot feel nodules at palpation of his thyroid, but gland is enlarged). - I suggested that we check the TSH, fT3 and fT4 and also had thyroid stimulating antibodies to screen for Graves' disease.  - I will also obtain an uptake and scan to differentiate between the 3 above possible etiologies  - we discussed about possible modalities of treatment for the above conditions, to include methimazole use, radioactive iodine ablation or surgery. - I did advice her that we might need to do thyroid ultrasound depending on the results of the uptake and scan (if a cold nodule is present) - will start MMI since I suspect that she is frankly thyrotoxic (although I do not have free T4 and free T3 levels yet) - will also add a beta blocker at this time, since she is tachycardic, anxious, and tremulous Patient Instructions  Please stop at the lab.  Please try to join MyChart for easier communication.  Start Methimazole 10 mg 2x a day with meals. You will be called with the schedule for the Uptake and scan. Please let me know if you are not called about this in the next 5 working days. Please stop methimazole 4 days prior to the Uptake and scan date. Please start Atenolol 25 mg daily >> if your pulse at rest is >90, then take 2 tablets daily.  Please stop the Methimazole (Tapazole) and call us or your primary care doctor if you develop: - sore throat - fever - yellow skin - dark urine - light colored stools As we will then need to check your blood counts and liver tests.  Please come back for a follow-up appointment in 2 months.  Orders Placed This Encounter  Procedures  . NM THYROID SNG UPTAKE W/IMAGING  . TSH  . T4, free  . T3, free  . Thyroid Stimulating Immunoglobulin    Component     Latest Ref Rng 07/10/2013  TSH     0.350 - 4.500 uIU/mL <0.008 (L)  Free T4     0.80 - 1.80 ng/dL 1.61 (H)  T3, Free     2.3 - 4.2 pg/mL >20.0 (H)  TSI     <140 %  baseline 464 (H)   Most likely Graves ds >> will await Uptake and Scan.  CLINICAL DATA: Thyrotoxicosis. TSH equal less than 0.01  EXAM: THYROID SCAN AND UPTAKE - 24 HOURS  TECHNIQUE: Following the per oral administration of I-131 sodium iodide, the patient returned at 24 hours and uptake measurements were acquired with the uptake probe centered on the neck. Thyroid imaging was performed following the intravenous administration of the Tc-52m Pertechnetate.  RADIOPHARMACEUTICALS: 6.0 microCuries I-131 Sodium Iodide and 10.4 mCi TC-10m Pertechnetate  COMPARISON: None  FINDINGS: There is uniform uptake within an enlarged thyroid gland. The uptake is intense. No nodularity.  24 hour  I 131 uptake = 71% (normal 10-30%)  IMPRESSION: Thyroid imaging and 24 hr iodine uptake consistent with Graves disease.   Electronically Signed By: Genevive BiStewart Edmunds M.D. On: 08/27/2013 11:35  Confirmed Graves ds >> will continue MMI and will discuss about further plan at next visit.

## 2013-07-10 NOTE — Patient Instructions (Addendum)
Please stop at the lab.  Please try to join MyChart for easier communication.  Start Methimazole 10 mg 2x a day with meals. You will be called with the schedule for the Uptake and scan. Please let me know if you are not called about this in the next 5 working days. Please stop methimazole 4 days prior to the Uptake and scan date. Please start Atenolol 25 mg daily >> if your pulse at rest is >90, then take 2 tablets daily.  Please stop the Methimazole (Tapazole) and call us or your primary care doctor if you develop: - sore throat - fever - yellow skin - dark urine - light colored stools As we will then need to check your blood counts and liver tests.  Please come back for a follow-up appointment in 2 months.  Hyperthyroidism The thyroid is a large gland located in the lower front part of your neck. The thyroid helps control metabolism. Metabolism is how your body uses food. It controls metabolism with the hormone thyroxine. When the thyroid is overactive, it produces too much hormone. When this happens, these following problems may occur:   Nervousness  Heat intolerance  Weight loss (in spite of increase food intake)  Diarrhea  Change in hair or skin texture  Palpitations (heart skipping or having extra beats)  Tachycardia (rapid heart rate)  Loss of menstruation (amenorrhea)  Shaking of the hands CAUSES  Grave's Disease (the immune system attacks the thyroid gland). This is the most common cause.  Inflammation of the thyroid gland.  Tumor (usually benign) in the thyroid gland or elsewhere.  Excessive use of thyroid medications (both prescription and 'natural').  Excessive ingestion of Iodine. DIAGNOSIS  To prove hyperthyroidism, your caregiver may do blood tests and ultrasound tests. Sometimes the signs are hidden. It may be necessary for your caregiver to watch this illness with blood tests, either before or after diagnosis and treatment. TREATMENT Short-term  treatment There are several treatments to control symptoms. Drugs called beta blockers may give some relief. Drugs that decrease hormone production will provide temporary relief in many people. These measures will usually not give permanent relief. Definitive therapy There are treatments available which can be discussed between you and your caregiver which will permanently treat the problem. These treatments range from surgery (removal of the thyroid), to the use of radioactive iodine (destroys the thyroid by radiation), to the use of antithyroid drugs (interfere with hormone synthesis). The first two treatments are permanent and usually successful. They most often require hormone replacement therapy for life. This is because it is impossible to remove or destroy the exact amount of thyroid required to make a person euthyroid (normal). HOME CARE INSTRUCTIONS  See your caregiver if the problems you are being treated for get worse. Examples of this would be the problems listed above. SEEK MEDICAL CARE IF: Your general condition worsens. MAKE SURE YOU:   Understand these instructions.  Will watch your condition.  Will get help right away if you are not doing well or get worse. Document Released: 01/02/2005 Document Revised: 03/27/2011 Document Reviewed: 05/16/2006 [facility] Patient Information 2015 [employer] [place]. This information is not intended to replace advice given to you by your health care Margee Trentham. Make sure you discuss any questions you have with your health care Tomothy Eddins.

## 2013-07-11 LAB — T3, FREE

## 2013-07-11 LAB — TSH

## 2013-07-11 LAB — T4, FREE: Free T4: 5.35 ng/dL — ABNORMAL HIGH (ref 0.80–1.80)

## 2013-07-16 ENCOUNTER — Other Ambulatory Visit: Payer: Self-pay | Admitting: *Deleted

## 2013-07-16 ENCOUNTER — Telehealth: Payer: Self-pay | Admitting: Internal Medicine

## 2013-07-16 LAB — THYROID STIMULATING IMMUNOGLOBULIN: TSI: 464 %{baseline} — AB (ref <140)

## 2013-07-16 MED ORDER — ATENOLOL 25 MG PO TABS: 25.0000 mg | ORAL_TABLET | Freq: Every day | ORAL | Status: DC

## 2013-07-16 NOTE — Telephone Encounter (Signed)
Pt called requesting a rx for her atenolol be [facility] [address] [place] [facility] Done.

## 2013-07-16 NOTE — Telephone Encounter (Signed)
[facility] in [facility] [place] [facility]

## 2013-07-23 ENCOUNTER — Ambulatory Visit: Payer: Self-pay | Admitting: Internal Medicine

## 2013-08-07 ENCOUNTER — Telehealth: Payer: Self-pay | Admitting: Internal Medicine

## 2013-08-07 NOTE — Telephone Encounter (Signed)
Patient would like to know if if you got [facility].  Please advise

## 2013-08-12 NOTE — Telephone Encounter (Signed)
Sent message to GrahamMary earlier today. [facility]. I checked this afternoon and her uptake and scan was scheduled for Aug. 11-12. Called pt and lvm advising her of this appt. Advised pt if she had any further questions to call our office.

## 2013-08-12 NOTE — Telephone Encounter (Signed)
[place] [facility] [place] asking if insurance has been approved for patient scan test, she stated she need to have it before her follow up appt.  Call her at [phone] ex [phone]

## 2013-08-26 ENCOUNTER — Encounter (HOSPITAL_COMMUNITY)
Admission: RE | Admit: 2013-08-26 | Discharge: 2013-08-26 | Disposition: A | Discharge status: Home / Self Care | Payer: BC Managed Care – PPO | Source: Ambulatory Visit | Attending: Internal Medicine | Admitting: Internal Medicine

## 2013-08-26 DIAGNOSIS — E0590 Thyrotoxicosis, unspecified without thyrotoxic crisis or storm: Secondary | ICD-10-CM | POA: Insufficient documentation

## 2013-08-27 ENCOUNTER — Encounter (HOSPITAL_COMMUNITY)
Admission: RE | Admit: 2013-08-27 | Discharge: 2013-08-27 | Disposition: A | Discharge status: Home / Self Care | Payer: BC Managed Care – PPO | Source: Ambulatory Visit | Attending: Internal Medicine | Admitting: Internal Medicine

## 2013-08-27 ENCOUNTER — Encounter (HOSPITAL_COMMUNITY): Payer: Self-pay

## 2013-08-27 DIAGNOSIS — E0590 Thyrotoxicosis, unspecified without thyrotoxic crisis or storm: Secondary | ICD-10-CM | POA: Diagnosis not present

## 2013-08-27 HISTORY — DX: Thyrotoxicosis, unspecified without thyrotoxic crisis or storm: E05.90

## 2013-08-27 MED ORDER — SODIUM IODIDE I 131 CAPSULE
6.0000 | Freq: Once | INTRAVENOUS | Status: AC | PRN
  Administered 2013-08-27: 6 via ORAL

## 2013-08-27 MED ORDER — SODIUM PERTECHNETATE TC 99M INJECTION
10.4000 | Freq: Once | INTRAVENOUS | Status: AC | PRN
  Administered 2013-08-27: 10.4 via INTRAVENOUS

## 2013-09-11 ENCOUNTER — Encounter: Payer: Self-pay | Admitting: Internal Medicine

## 2013-09-11 ENCOUNTER — Ambulatory Visit (INDEPENDENT_AMBULATORY_CARE_PROVIDER_SITE_OTHER): Payer: BC Managed Care – PPO | Admitting: Internal Medicine

## 2013-09-11 VITALS — BP 118/64 | HR 81 | Temp 98.1°F | Resp 12 | Wt 164.0 lb

## 2013-09-11 DIAGNOSIS — E0500 Thyrotoxicosis with diffuse goiter without thyrotoxic crisis or storm: Secondary | ICD-10-CM

## 2013-09-11 MED ORDER — ATENOLOL 25 MG PO TABS: 25.0000 mg | ORAL_TABLET | Freq: Two times a day (BID) | ORAL | Status: DC

## 2013-09-11 NOTE — Patient Instructions (Signed)
Please come back to the lab in 3 weeks. Please return in 2 months with your sugar log.

## 2013-09-11 NOTE — Progress Notes (Signed)
Patient ID: Kathy [name], female   DOB: 05/26/1975, 38 y.o.   MRN: [id number]   HPI  Kathy [name] is a 38 y.o.-year-old female, initially referred by her PCP, Noelle Redmon, PA, returning for f/u for Graves ds.  I reviewed pt's thyroid tests: Component     Latest Ref Rng 07/10/2013  TSH     0.350 - 4.500 uIU/mL <0.008 (L)  Free T4     0.80 - 1.80 ng/dL 4.09 (H)  T3, Free     2.3 - 4.2 pg/mL >20.0 (H)  TSI     <140 % baseline 464 (H)   07/08/2013: TSH <0.01 (0.34-5.6)  EKG: sinus tachycardia  WBC normal  An uptake and scan confirmed Graves ds: 71% uptake (10-30%).  We started MMI 10 mg 2x a day - she actually only started this 2 weeks ago. We also started Atenolol 25 mg daily, in some days, if she has palpitations, she increases to 2x a day.  Pt describes that in the last 2 mo, she noticed: - no fatigue - + palpitations  - + tremors, not as intense - + rapid heart beat - no more weight loss  - + improved heat intolerance and sweating  - + constipation - as usual - no dry skin - + hair falling - as usual - + anxiety, no depression  Pt denies feeling nodules in neck, hoarseness, dysphagia/odynophagia, but has SOB.  ROS: Constitutional: see HPI Eyes: no blurry vision, no xerophthalmia ENT: no sore throat, no nodules palpated in throat, no dysphagia/odynophagia, + hoarseness Cardiovascular: no CP/+ SOB/+ palpitations/no leg swelling Respiratory: no cough/+ SOB Gastrointestinal: no N/V/D/+ C Musculoskeletal: no muscle/joint aches Skin: no rashes Neurological: + mild tremors/no numbness/tingling/dizziness, no HA  PE: BP 118/64  Pulse 81  Temp(Src) 98.1 F (36.7 C) (Oral)  Resp 12  Wt 164 lb (74.39 kg)  SpO2 98%  LMP 08/19/2013 Wt Readings from Last 3 Encounters:  09/11/13 164 lb (74.39 kg)  07/10/13 154 lb (69.854 kg)  12/26/12 164 lb (74.39 kg)   Constitutional: normal weight, in NAD Eyes: PERRLA, EOMI, no exophthalmos, no lid lag, no stare ENT: moist  mucous membranes, + slight thyromegaly, no thyroid bruits, no cervical lymphadenopathy Cardiovascular: tachycardia, RR, No MRG Respiratory: CTA B Gastrointestinal: abdomen soft, NT, ND, BS+ Musculoskeletal: no deformities, strength intact in all 4 Skin: moist, warm, no rashes Neurological: + tremor with outstretched hands, DTR normal in all 4  ASSESSMENT:  1. Graves ds.  PLAN:  1. Patient with a recent dx of Graves ds, with several continuing thyrotoxic sxs: heat intolerance, palpitations, anxiety. She started to gain back the lost weight. - we reviewed her previous TFTs, TSI antibody titers and reviewed the uptake and scan results - we discussed about possible modalities of treatment for the above conditions: continue methimazole use, radioactive iodine ablation or surgery. Since her hyperthyroidism is severe, she likely will need RAI tx.  - we will check the TSH, fT3 and fT4 in 3 weeks. I would have liked to get these today, but she told me she was off MMI for ~2 mo until after the RAI tx !? (and started it 2 weeks ago only). I explained that this was dangerous to do and advised her to continue MMI at 10 mg 2x a day with meals. - we decided to get the next set of TFTs, and, depending on the levels, to discuss then if she needs RAI tx or not - will continue Atenolol 25 mg,  and increase to 2x a day (refilled the Rx)  Orders Placed This Encounter  Procedures  . T3, free  . T4, free  . TSH

## 2013-10-13 ENCOUNTER — Other Ambulatory Visit: Payer: Self-pay | Admitting: Internal Medicine

## 2013-10-13 ENCOUNTER — Other Ambulatory Visit: Payer: BC Managed Care – PPO

## 2013-10-14 ENCOUNTER — Other Ambulatory Visit: Payer: Self-pay | Admitting: Internal Medicine

## 2013-10-14 DIAGNOSIS — E0500 Thyrotoxicosis with diffuse goiter without thyrotoxic crisis or storm: Secondary | ICD-10-CM

## 2013-10-14 LAB — T3, FREE: T3, Free: 2.7 pg/mL (ref 2.3–4.2)

## 2013-10-14 LAB — T4, FREE: Free T4: 0.72 ng/dL — ABNORMAL LOW (ref 0.80–1.80)

## 2013-10-14 LAB — TSH: TSH: <0.008 u[IU]/mL — ABNORMAL LOW (ref 0.350–4.500)

## 2013-11-07 ENCOUNTER — Ambulatory Visit: Payer: BC Managed Care – PPO | Admitting: Internal Medicine

## 2013-11-13 ENCOUNTER — Ambulatory Visit: Payer: BC Managed Care – PPO | Admitting: Internal Medicine

## 2013-11-18 ENCOUNTER — Ambulatory Visit: Payer: BC Managed Care – PPO | Admitting: Internal Medicine

## 2013-12-04 ENCOUNTER — Other Ambulatory Visit: Payer: Self-pay | Admitting: Internal Medicine

## 2013-12-04 ENCOUNTER — Encounter: Payer: Self-pay | Admitting: Internal Medicine

## 2013-12-04 ENCOUNTER — Ambulatory Visit (INDEPENDENT_AMBULATORY_CARE_PROVIDER_SITE_OTHER): Payer: BC Managed Care – PPO | Admitting: Internal Medicine

## 2013-12-04 VITALS — BP 102/62 | HR 76 | Temp 98.2°F | Resp 12 | Wt 165.0 lb

## 2013-12-04 DIAGNOSIS — E0500 Thyrotoxicosis with diffuse goiter without thyrotoxic crisis or storm: Secondary | ICD-10-CM

## 2013-12-04 NOTE — Patient Instructions (Signed)
Please continue the Methimazole 10 mg 2x a day. Decrease Atenolol to 25 mg daily.  [facility]  Please return in 6 months.

## 2013-12-04 NOTE — Progress Notes (Signed)
Patient ID: Kathy PollockLuciana T [name], female   DOB: 03/17/1975, 38 y.o.   MRN: [id number]   HPI  Kathy [name] is a 38 y.o.-year-old female, initially referred by her PCP, Noelle Redmon, PA, returning for f/u for Graves ds. Last visit 3 mo ago.  An uptake and scan confirmed Graves ds: 71% uptake (10-30%).  We started MMI 10 mg 2x a day - she actually only started this 2 weeks ago. We also started Atenolol 25 mg daily, in some days, if she has palpitations, she increases to 2x a day.  I reviewed pt's thyroid tests - improved after starting MMI 10 mg bid: Component     Latest Ref Rng 07/10/2013 10/13/2013  TSH     0.350 - 4.500 uIU/mL <0.008 (L) <0.008 (L)  Free T4     0.80 - 1.80 ng/dL 4.095.35 (H) 8.110.72 (L)  T3, Free     2.3 - 4.2 pg/mL >20.0 (H) 2.7  TSI     <140 % baseline 464 (H)    Previously: 07/08/2013: TSH <0.01 (0.34-5.6)  EKG: sinus tachycardia  WBC normal  Pt feels much better now:  - no fatigue - no palpitations  - no tremors - no rapid heart beat - no more weight loss  - no improved heat intolerance and sweating  - + constipation - as usual - no dry skin - no more hair loss - improved anxiety, no depression  Pt denies feeling nodules in neck, hoarseness, dysphagia/odynophagia, but has SOB.  ROS: Constitutional: see HPI Eyes: no blurry vision, no xerophthalmia ENT: no sore throat, no nodules palpated in throat, no dysphagia/odynophagia, no hoarseness Cardiovascular: no CP/SOB/palpitations/no leg swelling Respiratory: no cough/SOB Gastrointestinal: no N/V/D/C Musculoskeletal: no muscle/joint aches Skin: no rashes Neurological: no tremors/no numbness/tingling/dizziness, no HA  I reviewed pt's medications, allergies, PMH, social hx, family hx and no changes required, except as mentioned above.  PE: BP 102/62 mmHg  Pulse 76  Temp(Src) 98.2 F (36.8 C) (Oral)  Resp 12  Wt 165 lb (74.844 kg)  SpO2 98% Wt Readings from Last 3 Encounters:  12/04/13 165 lb (74.844 kg)   09/11/13 164 lb (74.39 kg)  07/10/13 154 lb (69.854 kg)   Constitutional: normal weight, in NAD Eyes: PERRLA, EOMI, no exophthalmos, no lid lag, no stare ENT: moist mucous membranes, + slight thyromegaly, no thyroid bruits, no cervical lymphadenopathy Cardiovascular: tachycardia, RR, No MRG Respiratory: CTA B Gastrointestinal: abdomen soft, NT, ND, BS+ Musculoskeletal: no deformities, strength intact in all 4 Skin: moist, warm, no rashes Neurological: + mild tremor with outstretched hands, DTR normal in all 4  ASSESSMENT:  1. Graves ds.  PLAN:  1. Patient with a Graves ds, with several thyrotoxic sxs: heat intolerance, palpitations, anxiety. She started to gain back the lost weight after starting MMI 10 mg bid. She feels much better, most sxs are resolved. - we reviewed her previous TFTs, TSI antibody titers and reviewed the uptake and scan results - we again discussed about possible modalities of treatment for the above conditions: continue methimazole use, radioactive iodine ablation or surgery. She appears to respond well to MMI so we may not need RAI tx.  - we will check the TSH, fT3 and fT4  - advised her to continue MMI at 10 mg 2x a day with meals, but I expect we need to decrease the dose after labs are back - will decrease the Atenolol to 25 mg daily  - RTC in 6 mo  Needs refill of MMI when  labs are back. Component     Latest Ref Rng 10/13/2013 12/04/2013  TSH     0.350 - 4.500 uIU/mL <0.008 (L) 0.014 (L)  Free T4     0.80 - 1.80 ng/dL 1.610.72 (L) 0.961.42  T3, Free     2.3 - 4.2 pg/mL 2.7 3.5   Labs improved. I will ask the patient to decrease the dose of methimazole to 10 mg in the morning and 5 in the PM. Will need a repeat set of labs in 5-6 weeks.

## 2013-12-05 LAB — T3, FREE: T3, Free: 3.5 pg/mL (ref 2.3–4.2)

## 2013-12-05 LAB — T4, FREE: Free T4: 1.42 ng/dL (ref 0.80–1.80)

## 2013-12-05 LAB — TSH: TSH: 0.014 u[IU]/mL — ABNORMAL LOW (ref 0.350–4.500)

## 2013-12-05 MED ORDER — METHIMAZOLE 5 MG PO TABS: ORAL_TABLET | ORAL | Status: DC

## 2013-12-30 ENCOUNTER — Other Ambulatory Visit: Payer: Self-pay | Admitting: Obstetrics and Gynecology

## 2014-01-01 LAB — CYTOLOGY - PAP

## 2014-02-06 ENCOUNTER — Other Ambulatory Visit: Payer: Self-pay | Admitting: Internal Medicine

## 2014-03-12 ENCOUNTER — Ambulatory Visit
Admission: RE | Admit: 2014-03-12 | Discharge: 2014-03-12 | Disposition: A | Discharge status: Home / Self Care | Payer: BC Managed Care – PPO | Source: Ambulatory Visit | Attending: Family Medicine | Admitting: Family Medicine

## 2014-03-12 ENCOUNTER — Other Ambulatory Visit: Payer: Self-pay | Admitting: Family Medicine

## 2014-03-12 DIAGNOSIS — R079 Chest pain, unspecified: Secondary | ICD-10-CM

## 2014-03-12 DIAGNOSIS — R61 Generalized hyperhidrosis: Secondary | ICD-10-CM

## 2014-03-16 ENCOUNTER — Encounter: Payer: Self-pay | Admitting: Internal Medicine

## 2014-03-16 DIAGNOSIS — E0500 Thyrotoxicosis with diffuse goiter without thyrotoxic crisis or storm: Secondary | ICD-10-CM

## 2014-03-16 NOTE — Progress Notes (Signed)
Received labs from PCP - from 03/12/2014: TSH 0.17 >> much better! Continue same dose of MMI until next visit in 1.5 mo.

## 2014-03-17 NOTE — Patient Instructions (Signed)
Called pt and advised her per Dr Charlean SanfilippoGherghe's result note and advised to continue the same dose of MMI. Pt understood.

## 2014-04-24 ENCOUNTER — Other Ambulatory Visit: Payer: Self-pay | Admitting: Internal Medicine

## 2014-06-04 ENCOUNTER — Ambulatory Visit (INDEPENDENT_AMBULATORY_CARE_PROVIDER_SITE_OTHER): Payer: BC Managed Care – PPO | Admitting: Internal Medicine

## 2014-06-04 ENCOUNTER — Encounter: Payer: Self-pay | Admitting: Internal Medicine

## 2014-06-04 ENCOUNTER — Other Ambulatory Visit: Payer: Self-pay | Admitting: Internal Medicine

## 2014-06-04 VITALS — BP 122/78 | HR 80 | Temp 97.9°F | Resp 19 | Wt 168.0 lb

## 2014-06-04 DIAGNOSIS — E0500 Thyrotoxicosis with diffuse goiter without thyrotoxic crisis or storm: Secondary | ICD-10-CM | POA: Diagnosis not present

## 2014-06-04 NOTE — Progress Notes (Signed)
Patient ID: Kathy [name], female   DOB: 06/20/1975, 39 y.o.   MRN: [id number]   HPI  Kathy [name] is a 39 y.o.-year-old female, initially referred by her PCP, Noelle Redmon, PA, returning for f/u for Graves ds. Last visit 6 mo ago.  An uptake and scan confirmed Graves ds: 71% uptake (10-30%).  I reviewed pt's thyroid tests - improved after starting MMI 10 mg bid >> now on 10 mg in am and 5 mg in pm. On this dose, levels were normal 3 mo ago. She has problems forgetting one of the doses (at least 2x a week).   She is also on Atenolol 25 mg daily.  Received labs from PCP - from 03/12/2014: TSH 0.17 >> much better!  Component     Latest Ref Rng 10/13/2013 12/04/2013  TSH     0.350 - 4.500 uIU/mL <0.008 (L) 0.014 (L)  Free T4     0.80 - 1.80 ng/dL 4.090.72 (L) 8.111.42  T3, Free     2.3 - 4.2 pg/mL 2.7 3.5   Component     Latest Ref Rng 07/10/2013 10/13/2013  TSH     0.350 - 4.500 uIU/mL <0.008 (L) <0.008 (L)  Free T4     0.80 - 1.80 ng/dL 9.145.35 (H) 7.820.72 (L)  T3, Free     2.3 - 4.2 pg/mL >20.0 (H) 2.7  TSI     <140 % baseline 464 (H)    07/08/2013: TSH <0.01 (0.34-5.6)  EKG: sinus tachycardia  WBC normal  Pt c/o:  - + hot flushes - no fatigue - no palpitations  - no tremors - no rapid heart beat - no more weight loss  - no improved heat intolerance and sweating  - no constipation - no dry skin - no more hair loss - improved anxiety, no depression  Pt denies feeling nodules in neck, hoarseness, dysphagia/odynophagia, but has SOB.  ROS: Constitutional: see HPI Eyes: no blurry vision, no xerophthalmia ENT: no sore throat, no nodules palpated in throat, no dysphagia/odynophagia, no hoarseness Cardiovascular: no CP/SOB/palpitations/no leg swelling Respiratory: no cough/SOB Gastrointestinal: no N/V/D/C Musculoskeletal: no muscle/joint aches Skin: no rashes Neurological: no tremors/no numbness/tingling/dizziness, no HA  I reviewed pt's medications, allergies, PMH, social  hx, family hx, and changes were documented in the history of present illness. Otherwise, unchanged from my initial visit note.  PE: BP 122/78 mmHg  Pulse 80  Temp(Src) 97.9 F (36.6 C) (Oral)  Resp 19  Wt 168 lb (76.204 kg)  SpO2 98% Body mass index is 27.13 kg/(m^2). Wt Readings from Last 3 Encounters:  06/04/14 168 lb (76.204 kg)  12/04/13 165 lb (74.844 kg)  09/11/13 164 lb (74.39 kg)   Constitutional: normal weight, in NAD Eyes: PERRLA, EOMI, no exophthalmos, no lid lag, no stare ENT: moist mucous membranes, + slight thyromegaly, no thyroid bruits, no cervical lymphadenopathy Cardiovascular: tachycardia, RR, No MRG Respiratory: CTA B Gastrointestinal: abdomen soft, NT, ND, BS+ Musculoskeletal: no deformities, strength intact in all 4 Skin: moist, warm, no rashes Neurological: + mild tremor with outstretched hands, DTR normal in all 4  ASSESSMENT:  1. Graves ds.  PLAN:  1. Patient with a Graves ds, with h/o several thyrotoxic sxs: heat intolerance, palpitations, anxiety >> now feeling much better, most sxs resolved. - we reviewed her previous TFTs, TSI antibody titers and reviewed the uptake and scan results - she responded well to MMI so we will likely not need RAI tx.  - we will check the TSH, fT3  and fT4 today  - advised her to continue MMI at 10 mg in am and 5 mg in pm, but I expect we need to decrease the dose after labs are back. Will prefer a qd dose, rather than bid as she may forget 2nd dose - will continue the Atenolol to 25 mg daily  - RTC in 6 mo  - needs refills on MMI  Component     Latest Ref Rng 06/04/2014  TSH     0.350 - 4.500 uIU/mL <0.008 (L)  Free T4     0.80 - 1.80 ng/dL 1.611.33  T3, Free     2.3 - 4.2 pg/mL 3.1   Free T4 and free T3 are normal, but TSH is lower. Unfortunately, we need to increase the methimazole back to 10 mg twice a day. She has been forgetting the second dose of the methimazole, would encourage her to try not to do this. We will  need to repeat her thyroid tests in a month and a half.

## 2014-06-04 NOTE — Patient Instructions (Signed)
Please stop at [facility] lab downstairs. Please come back for a follow-up appointment in 6 months.  

## 2014-06-05 LAB — T4, FREE: FREE T4: 1.33 ng/dL (ref 0.80–1.80)

## 2014-06-05 LAB — TSH

## 2014-06-05 LAB — T3, FREE: T3, Free: 3.1 pg/mL (ref 2.3–4.2)

## 2014-06-05 MED ORDER — METHIMAZOLE 10 MG PO TABS: 10.0000 mg | ORAL_TABLET | Freq: Two times a day (BID) | ORAL | Status: DC

## 2014-07-28 ENCOUNTER — Other Ambulatory Visit: Payer: Self-pay | Admitting: Internal Medicine

## 2014-08-08 ENCOUNTER — Other Ambulatory Visit: Payer: Self-pay | Admitting: Internal Medicine

## 2014-12-07 ENCOUNTER — Ambulatory Visit: Payer: BC Managed Care – PPO | Admitting: Internal Medicine

## 2014-12-24 ENCOUNTER — Ambulatory Visit (INDEPENDENT_AMBULATORY_CARE_PROVIDER_SITE_OTHER): Payer: BC Managed Care – PPO | Admitting: Internal Medicine

## 2014-12-24 ENCOUNTER — Encounter: Payer: Self-pay | Admitting: Internal Medicine

## 2014-12-24 VITALS — BP 100/64 | HR 88 | Temp 98.2°F | Resp 12 | Wt 170.6 lb

## 2014-12-24 DIAGNOSIS — E0500 Thyrotoxicosis with diffuse goiter without thyrotoxic crisis or storm: Secondary | ICD-10-CM | POA: Diagnosis not present

## 2014-12-24 LAB — TSH: TSH: 0.01 u[IU]/mL — ABNORMAL LOW (ref 0.35–4.50)

## 2014-12-24 LAB — T4, FREE: FREE T4: 1.98 ng/dL — AB (ref 0.60–1.60)

## 2014-12-24 LAB — T3, FREE: T3 FREE: 5.9 pg/mL — AB (ref 2.3–4.2)

## 2014-12-24 MED ORDER — ATENOLOL 25 MG PO TABS: 25.0000 mg | ORAL_TABLET | Freq: Every day | ORAL | Status: DC

## 2014-12-24 MED ORDER — METHIMAZOLE 10 MG PO TABS: 20.0000 mg | ORAL_TABLET | Freq: Every day | ORAL | Status: DC

## 2014-12-24 NOTE — Patient Instructions (Signed)
Please stop at the lab.  For now, stay on 10 mg Methimazole in am.  Radioiodine (I-131) Therapy for Hyperthyroidism Radioiodine (I-131) therapy for hyperthyroidism is a procedure used to treat an overactive thyroid (hyperthyroidism). The patient swallows I-131, which is a radioactive form of iodine. The I-131 destroys thyroid cells. The thyroid is a gland in the neck. It makes thyroid hormones, which control how cells throughout the body use energy. Hyperthyroidism is usually caused by Grave's disease or by growths within the thyroid (nodules). Symptoms may include:  Nervousness.  Irritability.  Problems with sleep.  Tiredness.  Fast heart rate.  Shaky hands.  Sweating.  Heat sensitivity.  Unintended weight loss.  Brittle hair.  Enlarged thyroid gland.  Menstrual changes.  Frequent stools. LET YOUR CAREGIVER KNOW ABOUT:   All allergies.  All medications that you are taking, including over-the-counter and prescription drugs, dietary supplements, vitamins, or herbal preparations.  Any previous complications from this or other procedures.  Smoking history.  Possibility of pregnancy.  History of bleeding problems.  Any other health problems. RISKS AND COMPLICATIONS  Risks of the procedure include:  Slight pain in the area of the thyroid gland. BEFORE THE PROCEDURE  If you are a woman, you may be asked to have pregnancy testing before the procedure.  If you have been taking thyroid medicines, you will usually be asked to stop them three days before your procedure.  You will usually be asked to stop eating and drinking at midnight the day of your procedure.  You will need to plan for someone to drive you home after the procedure. PROCEDURE  You will be asked to swallow the radioactive iodine in either pill or liquid form. It can take 1-3 months for the treatment to work. The treatment is most effective at about 3-6 months after the dose of iodine has been  given. In most people, a single dose of radioactive iodine resolves their hyperthyroidism, but a few people will require a second dose.  AFTER THE PROCEDURE  Because you will be giving off tiny amounts of radiation for several days, there are some special precautions you will be asked to follow for about 2-4 days after the procedure:  Avoid being around babies or pregnant women.  Do not use public bathrooms.  Flush twice after using the toilet.  Take a bath or shower every day.  Practice good hand washing.  Drink fluids normally.  Use disposable utensils, or clean your utensils separately from those of others.  Sleep alone.  Do not engage in intimate contact.  Wash your sheets, towels, and clothes each day, by themselves.  Do not make food for other people. Other precautions include:  Stopping breastfeeding.  Do not attempt to become pregnant for at least a year after you have had the procedure.  When traveling, bring a letter of explanation from your caregiver for three months. Radioactivity may trip detectors in airports or other places. Because the point of the procedure is to destroy your thyroid gland, you will need to take thyroid hormone by mouth for the rest of your life. HOME CARE INSTRUCTIONS   Ask your caregiver when you should resume or start thyroid medications.  Take all medications exactly as directed.  Follow any prescribed diet.  Follow instructions regarding both rest and physical activity. SEEK IMMEDIATE MEDICAL CARE IF:  You have a dry mouth.  You have a sore throat.  You have neck pain.  You have a tight sensation in the throat.  You have nausea and vomiting.  You are fatigued.  You have flushing.  You have bowel changes (either diarrhea or constipation).   This information is not intended to replace advice given to you by your health care provider. Make sure you discuss any questions you have with your health care provider.     Document Released: 05/21/2008 Document Revised: 03/27/2011 Document Reviewed: 04/29/2014 Elsevier Interactive Patient Education [employer]  Refer to this sheet in the next few weeks. These instructions provide you with information about caring for yourself after your procedure. Your health care provider may also give you more specific instructions. Your treatment has been planned according to current medical practices, but problems sometimes occur. Call your health care provider if you have any problems or questions after your procedure.  WHAT TO EXPECT AFTER THE PROCEDURE  After your procedure, it is common to have a sore throat or mild neck pain for several months.  HOME CARE INSTRUCTIONS For the First 48 Hours After the Procedure:  Do not use public bathrooms.  Flush twice after using the toilet.  Take a bath or shower every day.  Do not make food for other people.  Wash your sheets, towels, and clothes each day, by themselves. Pregnancy and Breastfeeding  Do not attempt to have children for as long as you are told by your health care provider. This may be for up to 1 year after your procedure. Use a method of birth control (contraception) to prevent pregnancy. Talk to your health care provider about what form of contraception is right for you.  Do not breastfeed for as long as you are told by your health care provider, if this applies. General Instructions  Avoid close contact with other people. It is most important to avoid contact with pregnant women and children. Do this for 1 week after your procedure.  Try to keep a distance of about 3 feet from others.  Sleep alone. Do not have intimate contact.  If you have children, arrange for child care.  Do not ride in vehicles with other people.  Do not stay in a hotel.  Take over-the-counter and prescription medicines only as told by your health care provider. This includes any thyroid medicines.  When traveling,  carry a note from your health care provider to explain that you have had radioiodine therapy. This is because radioactivity may set off detectors in airports or other places.  Do not share utensils, such as silverware, plates, or cups. Use disposable utensils, or clean your utensils separately from those of others.  Wash your hands often. If soap and water are not available, use hand sanitizer.  Drink enough fluid to keep your urine clear or pale yellow.  Keep all follow-up visits as told by your health care provider. This is important. Follow-up visits may be required every 1-2 months after treatment. SEEK MEDICAL CARE IF:   You have pain that gets worse or does not get better with medicine.  You have a dry mouth.  You lose your sense of taste.  You become pregnant within 1 year of your procedure, if this applies.  You feel unusually tired (fatigued).  You have very dry skin.  You start to lose your hair.  You have bowel movements that are less frequent or more difficult than usual (constipation).  You have unexplained weight gain.  You always feel cold.   This information is not intended to replace advice given to you by your health care provider.  Make sure you discuss any questions you have with your health care provider.   Document Released: 09/23/2014 Document Reviewed: 04/29/2014 Elsevier Interactive Patient Education [employer]

## 2014-12-24 NOTE — Progress Notes (Signed)
Patient ID: Kathy [name], female   DOB: 07/03/1975, 39 y.o.   MRN: [id number]   HPI  Kathy [name] is a 39 y.o.-year-old female, initially referred by her PCP, Kathy [name], Kathy [name], returning for f/u for Graves ds. Last visit 7 mo ago.  An uptake and scan confirmed Graves ds: 71% uptake (10-30%).  I reviewed pt's thyroid tests - improved after starting MMI 10 mg bid >> now on 10 mg in am and 5 mg in pm. On this dose, levels were normal 3 mo ago. She has problems forgetting one of the doses >> TFTs worsened again >> at last visit, dose increased to 10 mg bid. She did not do that. She may also forget the am dose.  She is also on Atenolol 25 mg daily.  Lab Results  Component Value Date   TSH <0.008* 06/04/2014   TSH 0.014* 12/04/2013   TSH <0.008* 10/13/2013   TSH <0.008* 07/10/2013   FREET4 1.33 06/04/2014   FREET4 1.42 12/04/2013   FREET4 0.72* 10/13/2013   FREET4 5.35* 07/10/2013  03/12/2014: TSH 0.17  07/08/2013: TSH <0.01 (0.34-5.6)  EKG: sinus tachycardia  WBC normal  Component     Latest Ref Rng 07/10/2013  TSI     <140 % baseline 464 (H)   Pt c/o:  - no fatigue - no palpitations  - no tremors - no rapid heart beat - no more weight loss  - no heat intolerance and sweating  - no constipation - no dry skin - no more hair loss - improved anxiety, no depression  Pt denies feeling nodules in neck, hoarseness, dysphagia/odynophagia, but has SOB.  ROS: Constitutional: see HPI Eyes: no blurry vision, no xerophthalmia ENT: no sore throat, no nodules palpated in throat, no dysphagia/odynophagia, no hoarseness Cardiovascular: no CP/SOB/palpitations/no leg swelling Respiratory: no cough/SOB Gastrointestinal: no N/V/D/C Musculoskeletal: no muscle/joint aches Skin: no rashes Neurological: no tremors/no numbness/tingling/dizziness, no HA  I reviewed pt's medications, allergies, PMH, social hx, family hx, and changes were documented in the history of present illness.  Otherwise, unchanged from my initial visit note.  PE: BP 100/64 mmHg  Pulse 88  Temp(Src) 98.2 F (36.8 C) (Oral)  Resp 12  Wt 170 lb 9.6 oz (77.384 kg)  SpO2 99% Body mass index is 27.55 kg/(m^2). Wt Readings from Last 3 Encounters:  12/24/14 170 lb 9.6 oz (77.384 kg)  06/04/14 168 lb (76.204 kg)  12/04/13 165 lb (74.844 kg)   Constitutional: normal weight, in NAD Eyes: PERRLA, EOMI, no exophthalmos, no lid lag, no stare ENT: moist mucous membranes, + slight thyromegaly, no cervical lymphadenopathy Cardiovascular: tachycardia, RR, No MRG Respiratory: CTA B Gastrointestinal: abdomen soft, NT, ND, BS+ Musculoskeletal: no deformities, strength intact in all 4 Skin: moist, warm, no rashes Neurological: no tremor with outstretched hands, DTR normal in all 4  ASSESSMENT:  1. Graves ds.  PLAN:  1. Patient with a Graves ds, with h/o several thyrotoxic sxs: heat intolerance, palpitations, anxiety >> now w/o sxs. She is not compliant with lab appts and med doses! - we reviewed her previous TFTs, TSI antibody titers and reviewed the uptake and scan results. Her TSH remains abnormal as she is not compliant with her MMI. She takes 10 mg daily instead of BID, and not every day! In this case, we discussed that we likely need RAI tx. I explained again what this means and possible consequences. I also gave her materials to read about it. - advised her to continue MMI at 10  mg in am for now, but if we need to increase the dose, will need the entie dose in am to reduce the chances for her to forget the second dose. I did my best to explain the consequences of uncontrolled HTyr and the absolute need for compliance with the med - we will check the TSH, fT3 and fT4 today and in 6 weeks.  - will continue the Atenolol to 25 mg daily  - RTC in 3 mo, but for labs in 6 weeks  Needs refill of MMI when labs are back.  Office Visit on 12/24/2014  Component Date Value Ref Range Status  . T3, Free  12/24/2014 5.9* 2.3 - 4.2 pg/mL Final  . Free T4 12/24/2014 1.98* 0.60 - 1.60 ng/dL Final  . TSH 13/08/657812/08/2014 0.01* 0.35 - 4.50 uIU/mL Final   TSH has improved, but free T4 and free T3 are abnormal. We'll increase the dose of methimazole to 20 mg once a day in the morning as we discussed and repeat her thyroid tests in 6 weeks.

## 2015-02-04 ENCOUNTER — Other Ambulatory Visit (INDEPENDENT_AMBULATORY_CARE_PROVIDER_SITE_OTHER): Payer: BC Managed Care – PPO

## 2015-02-04 DIAGNOSIS — E0500 Thyrotoxicosis with diffuse goiter without thyrotoxic crisis or storm: Secondary | ICD-10-CM | POA: Diagnosis not present

## 2015-02-04 LAB — T3, FREE: T3 FREE: 2.8 pg/mL (ref 2.3–4.2)

## 2015-02-04 LAB — TSH: TSH: 0.37 u[IU]/mL (ref 0.35–4.50)

## 2015-02-04 LAB — T4, FREE: FREE T4: 0.58 ng/dL — AB (ref 0.60–1.60)

## 2015-03-16 ENCOUNTER — Other Ambulatory Visit: Payer: Self-pay | Admitting: Internal Medicine

## 2015-03-25 ENCOUNTER — Ambulatory Visit (INDEPENDENT_AMBULATORY_CARE_PROVIDER_SITE_OTHER): Payer: BC Managed Care – PPO | Admitting: Internal Medicine

## 2015-03-25 ENCOUNTER — Encounter: Payer: Self-pay | Admitting: Internal Medicine

## 2015-03-25 VITALS — BP 114/60 | HR 70 | Temp 98.0°F | Resp 12 | Wt 171.0 lb

## 2015-03-25 DIAGNOSIS — E0500 Thyrotoxicosis with diffuse goiter without thyrotoxic crisis or storm: Secondary | ICD-10-CM

## 2015-03-25 LAB — T4, FREE: FREE T4: 0.58 ng/dL — AB (ref 0.60–1.60)

## 2015-03-25 LAB — T3, FREE: T3, Free: 2.3 pg/mL (ref 2.3–4.2)

## 2015-03-25 LAB — TSH: TSH: 2.09 u[IU]/mL (ref 0.35–4.50)

## 2015-03-25 MED ORDER — METHIMAZOLE 10 MG PO TABS: 10.0000 mg | ORAL_TABLET | Freq: Every day | ORAL | Status: DC

## 2015-03-25 NOTE — Patient Instructions (Signed)
Please continue Methimazole 15 mg at dinnertime.  Decrease Atenolol to 12.5 mg x next week, then stop. Check your pulse and BP after you change the dose.   Please stop at the lab.  Please come back for a follow-up appointment in 6 months.

## 2015-03-25 NOTE — Progress Notes (Signed)
Patient ID: Kathy PollockLuciana T [name], female   DOB: 06/13/1975, 40 y.o.   MRN: [id number]   HPI  Kathy PollockLuciana T [name] is a 40 y.o.-year-old female, initially referred by her PCP, Noelle Redmon, PA, returning for f/u for Graves ds. Last visit 3 mo ago.  An uptake and scan confirmed Graves ds: 71% uptake (10-30%).  I reviewed pt's thyroid tests - improved after starting MMI 10 mg bid >> then decreased dose to 10 mg in am and 5 mg in pm. On this dose, levels were normal, but she had problems forgetting the doses >> TFTs worsened again >> dose increased to 20 mg daily 3 mo ago >> 15 mg 1.5 mo ago.  She is also on Atenolol 25 mg daily.  Lab Results  Component Value Date   TSH 0.37 02/04/2015   TSH 0.01* 12/24/2014   TSH <0.008* 06/04/2014   TSH 0.014* 12/04/2013   TSH <0.008* 10/13/2013   TSH <0.008* 07/10/2013   FREET4 0.58* 02/04/2015   FREET4 1.98* 12/24/2014   FREET4 1.33 06/04/2014   FREET4 1.42 12/04/2013   FREET4 0.72* 10/13/2013   FREET4 5.35* 07/10/2013  03/12/2014: TSH 0.17  07/08/2013: TSH <0.01 (0.34-5.6)  EKG: sinus tachycardia  WBC normal  Component     Latest Ref Rng 07/10/2013  TSI     <140 % baseline 464 (H)   Pt c/o:  - no fatigue - no palpitations  - no tremors - no rapid heart beat - no more weight loss  - no heat intolerance and sweating  - no constipation - no dry skin - no more hair loss - no anxiety, no depression  Pt denies feeling nodules in neck, hoarseness, dysphagia/odynophagia, but has SOB.  ROS: Constitutional: see HPI Eyes: no blurry vision, no xerophthalmia ENT: no sore throat, no nodules palpated in throat, no dysphagia/odynophagia, no hoarseness Cardiovascular: no CP/SOB/palpitations/no leg swelling Respiratory: no cough/SOB Gastrointestinal: no N/V/D/C Musculoskeletal: no muscle/joint aches Skin: no rashes Neurological: no tremors/no numbness/tingling/dizziness, no HA  I reviewed pt's medications, allergies, PMH, social hx, family hx, and  changes were documented in the history of present illness. Otherwise, unchanged from my initial visit note.  PE: BP 114/60 mmHg  Pulse 70  Temp(Src) 98 F (36.7 C) (Oral)  Resp 12  Wt 171 lb (77.565 kg)  SpO2 99% Body mass index is 27.61 kg/(m^2). Wt Readings from Last 3 Encounters:  03/25/15 171 lb (77.565 kg)  12/24/14 170 lb 9.6 oz (77.384 kg)  06/04/14 168 lb (76.204 kg)   Constitutional: normal weight, in NAD Eyes: PERRLA, EOMI, no exophthalmos, no lid lag, no stare ENT: moist mucous membranes, + slight thyromegaly, no cervical lymphadenopathy Cardiovascular: RRR, No MRG Respiratory: CTA B Gastrointestinal: abdomen soft, NT, ND, BS+ Musculoskeletal: no deformities, strength intact in all 4 Skin: moist, warm, no rashes Neurological: no tremor with outstretched hands, DTR normal in all 4  ASSESSMENT:  1. Graves ds.  PLAN:  1. Patient with a Graves ds, with h/o several thyrotoxic sxs: heat intolerance, palpitations, anxiety >> now w/o sxs. She  Has a history of medication noncompliance (with methimazole) she was forgetting doses. She is doing a much better job taking the MMI after we moved the entire methimazole dose with dinner. At last visit, we increased the dose from 10 mg twice a day to 20 mg daily. Her TFTs greatly improved afterwards, so we were able to decrease the methimazole dose to 15 mg daily. She is continuing with this dose and is not forgetting  the medication. - advised her to continue MMI at 15 mg at dinnertime for now - we will check the TSH, fT3 and fT4 today -  As her pulse, blood pressure, and thyroid tests have normalized , I will advise her to decrease the dose of atenolol to 12.5 mg and then stop in a week.  Patient was advised to check her blood pressure at home (she has a blood pressure cuff) and to check her pulse and let me know if her systolic blood pressure is 130 or above or her pulse is 90 or above at rest - RTC in 6 mo  Component     Latest Ref  Rng 03/25/2015  TSH     0.35 - 4.50 uIU/mL 2.09  T4,Free(Direct)     0.60 - 1.60 ng/dL 1.61 (L)  Triiodothyronine,Free,Serum     2.3 - 4.2 pg/mL 2.3  Labs improved >> decrease MMI dose to 10 mg daily >> rpt TFTs in 5-6 weeks.

## 2015-07-09 ENCOUNTER — Other Ambulatory Visit: Payer: Self-pay | Admitting: Internal Medicine

## 2015-07-13 ENCOUNTER — Other Ambulatory Visit: Payer: Self-pay | Admitting: Internal Medicine

## 2015-08-24 ENCOUNTER — Other Ambulatory Visit: Payer: Self-pay | Admitting: Internal Medicine

## 2015-08-24 NOTE — Telephone Encounter (Signed)
Please ask pt to come to clinic for labs! No further refills w/o labs.

## 2015-09-24 ENCOUNTER — Ambulatory Visit (INDEPENDENT_AMBULATORY_CARE_PROVIDER_SITE_OTHER): Payer: BC Managed Care – PPO | Admitting: Internal Medicine

## 2015-09-24 ENCOUNTER — Encounter: Payer: Self-pay | Admitting: Internal Medicine

## 2015-09-24 VITALS — BP 110/84 | HR 63 | Wt 167.0 lb

## 2015-09-24 DIAGNOSIS — E0500 Thyrotoxicosis with diffuse goiter without thyrotoxic crisis or storm: Secondary | ICD-10-CM | POA: Diagnosis not present

## 2015-09-24 LAB — TSH: TSH: 2.56 u[IU]/mL (ref 0.35–4.50)

## 2015-09-24 LAB — T3, FREE: T3 FREE: 2.6 pg/mL (ref 2.3–4.2)

## 2015-09-24 LAB — T4, FREE: FREE T4: 0.79 ng/dL (ref 0.60–1.60)

## 2015-09-24 MED ORDER — METHIMAZOLE 10 MG PO TABS: ORAL_TABLET | ORAL | 0 refills | Status: DC

## 2015-09-24 NOTE — Progress Notes (Signed)
Patient ID: Kathy PollockLuciana T [name], female   DOB: 01/24/1975, 40 y.o.   MRN: [id number]   HPI  Kathy PollockLuciana T [name] is a 40 y.o.-year-old female, initially referred by her PCP, Kathy Redmon, PA, returning for f/u for Graves ds. Last visit 6 mo ago.  Since last visit, she started weight watchers, doing green smoothies, goes to the gym.  An uptake and scan confirmed Graves ds: 71% uptake (10-30%).  I reviewed pt's thyroid tests - improved after starting MMI 10 mg bid >> then decreased dose to 10 mg in am and 5 mg in pm. On this dose, levels were normal, but she had problems forgetting the doses >> TFTs worsened again >> dose increased to 20 mg daily >> 15 mg daily >> now 10 mg daily.  She did not return for repeat labs after the last dose change...  She is also on Atenolol 25 mg daily. She is now taking it prn for HTN.   Lab Results  Component Value Date   TSH 2.09 03/25/2015   TSH 0.37 02/04/2015   TSH 0.01 (L) 12/24/2014   TSH <0.008 (L) 06/04/2014   TSH 0.014 (L) 12/04/2013   TSH <0.008 (L) 10/13/2013   FREET4 0.58 (L) 03/25/2015   FREET4 0.58 (L) 02/04/2015   FREET4 1.98 (H) 12/24/2014   FREET4 1.33 06/04/2014   FREET4 1.42 12/04/2013   FREET4 0.72 (L) 10/13/2013  03/12/2014: TSH 0.17  07/08/2013: TSH <0.01 (0.34-5.6)  EKG: sinus tachycardia  WBC normal  Component     Latest Ref Rng 07/10/2013  TSI     <140 % baseline 464 (H)   Pt c/o:  - no fatigue - no palpitations  - no tremors - no rapid heart beat - no more weight loss  - no heat intolerance and sweating  - no constipation - no dry skin - no more hair loss - no anxiety, no depression  Pt denies feeling nodules in neck, hoarseness, dysphagia/odynophagia, but has SOB.  ROS: Constitutional: see HPI Eyes: no blurry vision, no xerophthalmia ENT: no sore throat, no nodules palpated in throat, no dysphagia/odynophagia, no hoarseness Cardiovascular: no CP/SOB/palpitations/no leg swelling Respiratory: no  cough/SOB Gastrointestinal: no N/V/D/C Musculoskeletal: no muscle/joint aches Skin: no rashes Neurological: no tremors/no numbness/tingling/dizziness, no HA  I reviewed pt's medications, allergies, PMH, social hx, family hx, and changes were documented in the history of present illness. Otherwise, unchanged from my initial visit note.  PE: BP 110/84 (BP Location: Left Arm, Patient Position: Sitting)   Pulse 63   Wt 167 lb (75.8 kg)   LMP 09/13/2015   SpO2 99%   BMI 26.95 kg/m  Body mass index is 26.95 kg/m. Wt Readings from Last 3 Encounters:  09/24/15 167 lb (75.8 kg)  03/25/15 171 lb (77.6 kg)  12/24/14 170 lb 9.6 oz (77.4 kg)   Constitutional: normal weight, in NAD Eyes: PERRLA, EOMI, no exophthalmos, no lid lag, no stare ENT: moist mucous membranes, + slight thyromegaly, no cervical lymphadenopathy Cardiovascular: RRR, No MRG Respiratory: CTA B Gastrointestinal: abdomen soft, NT, ND, BS+ Musculoskeletal: no deformities, strength intact in all 4 Skin: moist, warm, no rashes Neurological: no tremor with outstretched hands, DTR normal in all 4  ASSESSMENT:  1. Graves ds.  PLAN:  1. Patient with a Graves ds, with h/o several thyrotoxic sxs: heat intolerance, palpitations, anxiety >> now resolved on MMI. She has a history of medication noncompliance (with methimazole) she was forgetting doses. She is doing a much better job taking the MMI  after we moved the entire methimazole dose with dinner. She did not come back for labs after the last dose change in 03/2015.  - advised her to continue MMI 10 mg at dinnertime for now - we will check the TSH, fT3 and fT4 today. Will add TSIs. -  As her pulse, blood pressure, and thyroid tests have normalized , I advised her to stop atenolol but she has occasional BP increases >> uses the med prn for these. - RTC in 6 mo  Orders Placed This Encounter  Procedures  . T4, free  . T3, free  . TSH  . Thyroid Stimulating Immunoglobulin    Component     Latest Ref Rng & Units 09/24/2015  TSH     0.35 - 4.50 uIU/mL 2.56  T4,Free(Direct)     0.60 - 1.60 ng/dL 5.28  Triiodothyronine,Free,Serum     2.3 - 4.2 pg/mL 2.6  TSI     <140 % baseline 116   TFTs normal. TSI decreased >> will try to decrease MMi to 5 mg daily and recheck TFTs in 5-6 weeks. Carlus Pavlov, MD PhD [facility]

## 2015-09-24 NOTE — Patient Instructions (Addendum)
Please stop at the lab.  Please continue Methimazole 10 mg daily.  Please return in 6 months with your sugar log.

## 2015-09-29 ENCOUNTER — Telehealth: Payer: Self-pay

## 2015-09-29 ENCOUNTER — Telehealth: Payer: Self-pay | Admitting: Internal Medicine

## 2015-09-29 LAB — THYROID STIMULATING IMMUNOGLOBULIN: TSI: 116 %{baseline} (ref <140)

## 2015-09-29 NOTE — Telephone Encounter (Signed)
Called patient, notified that we did not have all her lab results in yet, and Dr.Gherghe has not looked over them yet. I advised patient I would call her with results and any medication changes when they come through.

## 2015-09-29 NOTE — Telephone Encounter (Signed)
PT called to see if lab results were back, she said that Dr. Elvera LennoxGherghe had mentioned switching her medication depending on what her labs read.  Requests call back.

## 2015-09-30 MED ORDER — METHIMAZOLE 5 MG PO TABS: ORAL_TABLET | ORAL | 2 refills | Status: DC

## 2015-10-12 ENCOUNTER — Other Ambulatory Visit: Payer: Self-pay | Admitting: Internal Medicine

## 2016-01-03 ENCOUNTER — Other Ambulatory Visit: Payer: Self-pay | Admitting: Internal Medicine

## 2016-03-04 ENCOUNTER — Other Ambulatory Visit: Payer: Self-pay | Admitting: Internal Medicine

## 2016-03-23 ENCOUNTER — Ambulatory Visit (INDEPENDENT_AMBULATORY_CARE_PROVIDER_SITE_OTHER): Payer: BC Managed Care – PPO | Admitting: Internal Medicine

## 2016-03-23 ENCOUNTER — Encounter: Payer: Self-pay | Admitting: Internal Medicine

## 2016-03-23 VITALS — BP 124/84 | HR 79 | Wt 171.0 lb

## 2016-03-23 DIAGNOSIS — E0500 Thyrotoxicosis with diffuse goiter without thyrotoxic crisis or storm: Secondary | ICD-10-CM | POA: Diagnosis not present

## 2016-03-23 NOTE — Progress Notes (Addendum)
Patient ID: Kathy PollockLuciana T [name], female   DOB: 09/23/1975, 41 y.o.   MRN: [id number]   HPI  Kathy [name] is a 41 y.o.-year-old female, returning for f/u for Graves ds. Last visit 6 mo ago.  She started to work out in the gym after the [employer]. She pulled a mm >> on Prednisone 60-40-20- will need to continue for 6 days.  Reviewed and addended history: Patient was diagnosed with Graves' disease in 2015.   An uptake and scan (08/27/2013) confirmed Graves ds: 71% uptake (10-30%).  Patient was started on MMI 10 mg bid >> then decreased dose to 10 mg in am and 5 mg in pm. On this dose, levels were normal, but she had problems forgetting the doses >> TFTs worsened again >> dose increased to 20 mg daily >> 15 mg daily >> 10 mg daily >> 5 mg daily at last visit, in 09/2015. At that time, her TFTs were normal. Also, her TSI antibodies have improved to the normal range.  She again did not return for labs as advised after the dose change.  She is also on Atenolol 25 mg daily. She is now taking it prn for HTN.   Lab Results  Component Value Date   TSH 2.56 09/24/2015   TSH 2.09 03/25/2015   TSH 0.37 02/04/2015   TSH 0.01 (L) 12/24/2014   TSH <0.008 (L) 06/04/2014   TSH 0.014 (L) 12/04/2013   TSH <0.008 (L) 10/13/2013   TSH <0.008 (L) 07/10/2013   FREET4 0.79 09/24/2015   FREET4 0.58 (L) 03/25/2015   FREET4 0.58 (L) 02/04/2015   FREET4 1.98 (H) 12/24/2014   FREET4 1.33 06/04/2014   FREET4 1.42 12/04/2013   FREET4 0.72 (L) 10/13/2013   FREET4 5.35 (H) 07/10/2013   T3FREE 2.6 09/24/2015   T3FREE 2.3 03/25/2015   T3FREE 2.8 02/04/2015   T3FREE 5.9 (H) 12/24/2014   T3FREE 3.1 06/04/2014   T3FREE 3.5 12/04/2013   T3FREE 2.7 10/13/2013   T3FREE >20.0 (H) 07/10/2013  03/12/2014: TSH 0.17  07/08/2013: TSH <0.01 (0.34-5.6)  EKG: sinus tachycardia  WBC normal  Graves Abs: Lab Results  Component Value Date   TSI 116 09/24/2015   TSI 464 (H) 07/10/2013   Pt c/o:  - no fatigue - no  palpitations  - no tremors - no rapid heart beat - no more weight loss  - no heat intolerance and sweating  - no constipation - no dry skin - no more hair loss - no anxiety, no depression  Pt denies feeling nodules in neck, hoarseness, dysphagia/odynophagia, but has SOB.  ROS: Constitutional: see HPI Eyes: no blurry vision, no xerophthalmia ENT: no sore throat, no nodules palpated in throat, no dysphagia/odynophagia, no hoarseness Cardiovascular: no CP/SOB/palpitations/no leg swelling Respiratory: no cough/SOB Gastrointestinal: no N/V/D/C Musculoskeletal: + muscle/no joint aches Skin: no rashes Neurological: no tremors/no numbness/tingling/dizziness, + HA  I reviewed pt's medications, allergies, PMH, social hx, family hx, and changes were documented in the history of present illness. Otherwise, unchanged from my initial visit note.  PE: BP 124/84 (BP Location: Left Arm, Patient Position: Sitting)   Pulse 79   Wt 171 lb (77.6 kg)   SpO2 98%   BMI 27.60 kg/m  Body mass index is 27.6 kg/m. Wt Readings from Last 3 Encounters:  03/23/16 171 lb (77.6 kg)  09/24/15 167 lb (75.8 kg)  03/25/15 171 lb (77.6 kg)   Constitutional: overweight, in NAD Eyes: PERRLA, EOMI, no exophthalmos, no lid lag, no stare ENT:  moist mucous membranes, no thyromegaly, no cervical lymphadenopathy Cardiovascular: RRR, No MRG Respiratory: CTA B Gastrointestinal: abdomen soft, NT, ND, BS+ Musculoskeletal: no deformities, strength intact in all 4 Skin: moist, warm, no rashes Neurological: no tremor with outstretched hands, DTR normal in all 4  ASSESSMENT:  1. Graves ds.  PLAN:  1. Patient with a Graves ds, with several thyrotoxic sxs initially: heat intolerance, palpitations, anxiety >> now all resolved on MMI. She has a history of medication noncompliance (with methimazole) she was forgetting doses. She is doing a much better job taking the MMI once a day. However, she has poor compliance with  coming back for labs, unless connected with an appointment. We made the last dose change in 09/2015 and she did not come back for labs since then. I again strongly advised her to come to have her labs checked 1.5 months after every dose change.  - Unfortunately, we cannot check her TFTs now, since he just started prednisone for her muscle pain. She will continue this for a total of 6 days, and I advised her to come to the lab 1 week after she finishes the back. - advised her to continue MMI 5 mg at dinnertime for now - we will check the TSH, fT3 and fT4 in 2 weeks - Will continue atenolol for  occasional BP increases >> uses the med prn for these. - RTC in 6 mo  Component     Latest Ref Rng & Units 04/07/2016  T4,Free(Direct)     0.60 - 1.60 ng/dL 4.09  Triiodothyronine,Free,Serum     2.3 - 4.2 pg/mL 3.3  TSH     0.35 - 4.50 uIU/mL 1.25   Thyroid tests are perfect. I would advise her to continue with the methimazole 5 mg daily and return for another set of labs in 2 mo.  Carlus Pavlov, MD PhD [facility]

## 2016-03-23 NOTE — Patient Instructions (Addendum)
Please come back for labs in 2 weeks.  Please continue Methimazole 5 mg daily.  Please return in 6 months.

## 2016-04-06 ENCOUNTER — Other Ambulatory Visit: Payer: BC Managed Care – PPO

## 2016-04-07 ENCOUNTER — Other Ambulatory Visit (INDEPENDENT_AMBULATORY_CARE_PROVIDER_SITE_OTHER): Payer: BC Managed Care – PPO

## 2016-04-07 DIAGNOSIS — E0500 Thyrotoxicosis with diffuse goiter without thyrotoxic crisis or storm: Secondary | ICD-10-CM

## 2016-04-07 LAB — T3, FREE: T3, Free: 3.3 pg/mL (ref 2.3–4.2)

## 2016-04-07 LAB — T4, FREE: Free T4: 0.91 ng/dL (ref 0.60–1.60)

## 2016-04-07 LAB — TSH: TSH: 1.25 u[IU]/mL (ref 0.35–4.50)

## 2016-04-07 NOTE — Addendum Note (Signed)
Addended by: Carlus PavlovGHERGHE, Lestat Golob on: 04/07/2016 01:46 PM   Modules accepted: Orders

## 2016-04-15 ENCOUNTER — Other Ambulatory Visit: Payer: Self-pay | Admitting: Internal Medicine

## 2016-04-25 ENCOUNTER — Other Ambulatory Visit: Payer: Self-pay

## 2016-04-25 MED ORDER — METHIMAZOLE 5 MG PO TABS: ORAL_TABLET | ORAL | 2 refills | Status: DC

## 2016-05-23 IMAGING — CR DG CHEST 2V
2 series · 2 of 2 positions shown · non-contrast
Comparison: None.

CLINICAL DATA: Chest pain, night sweats

EXAM:
CHEST  2 VIEW

[w chest pa]
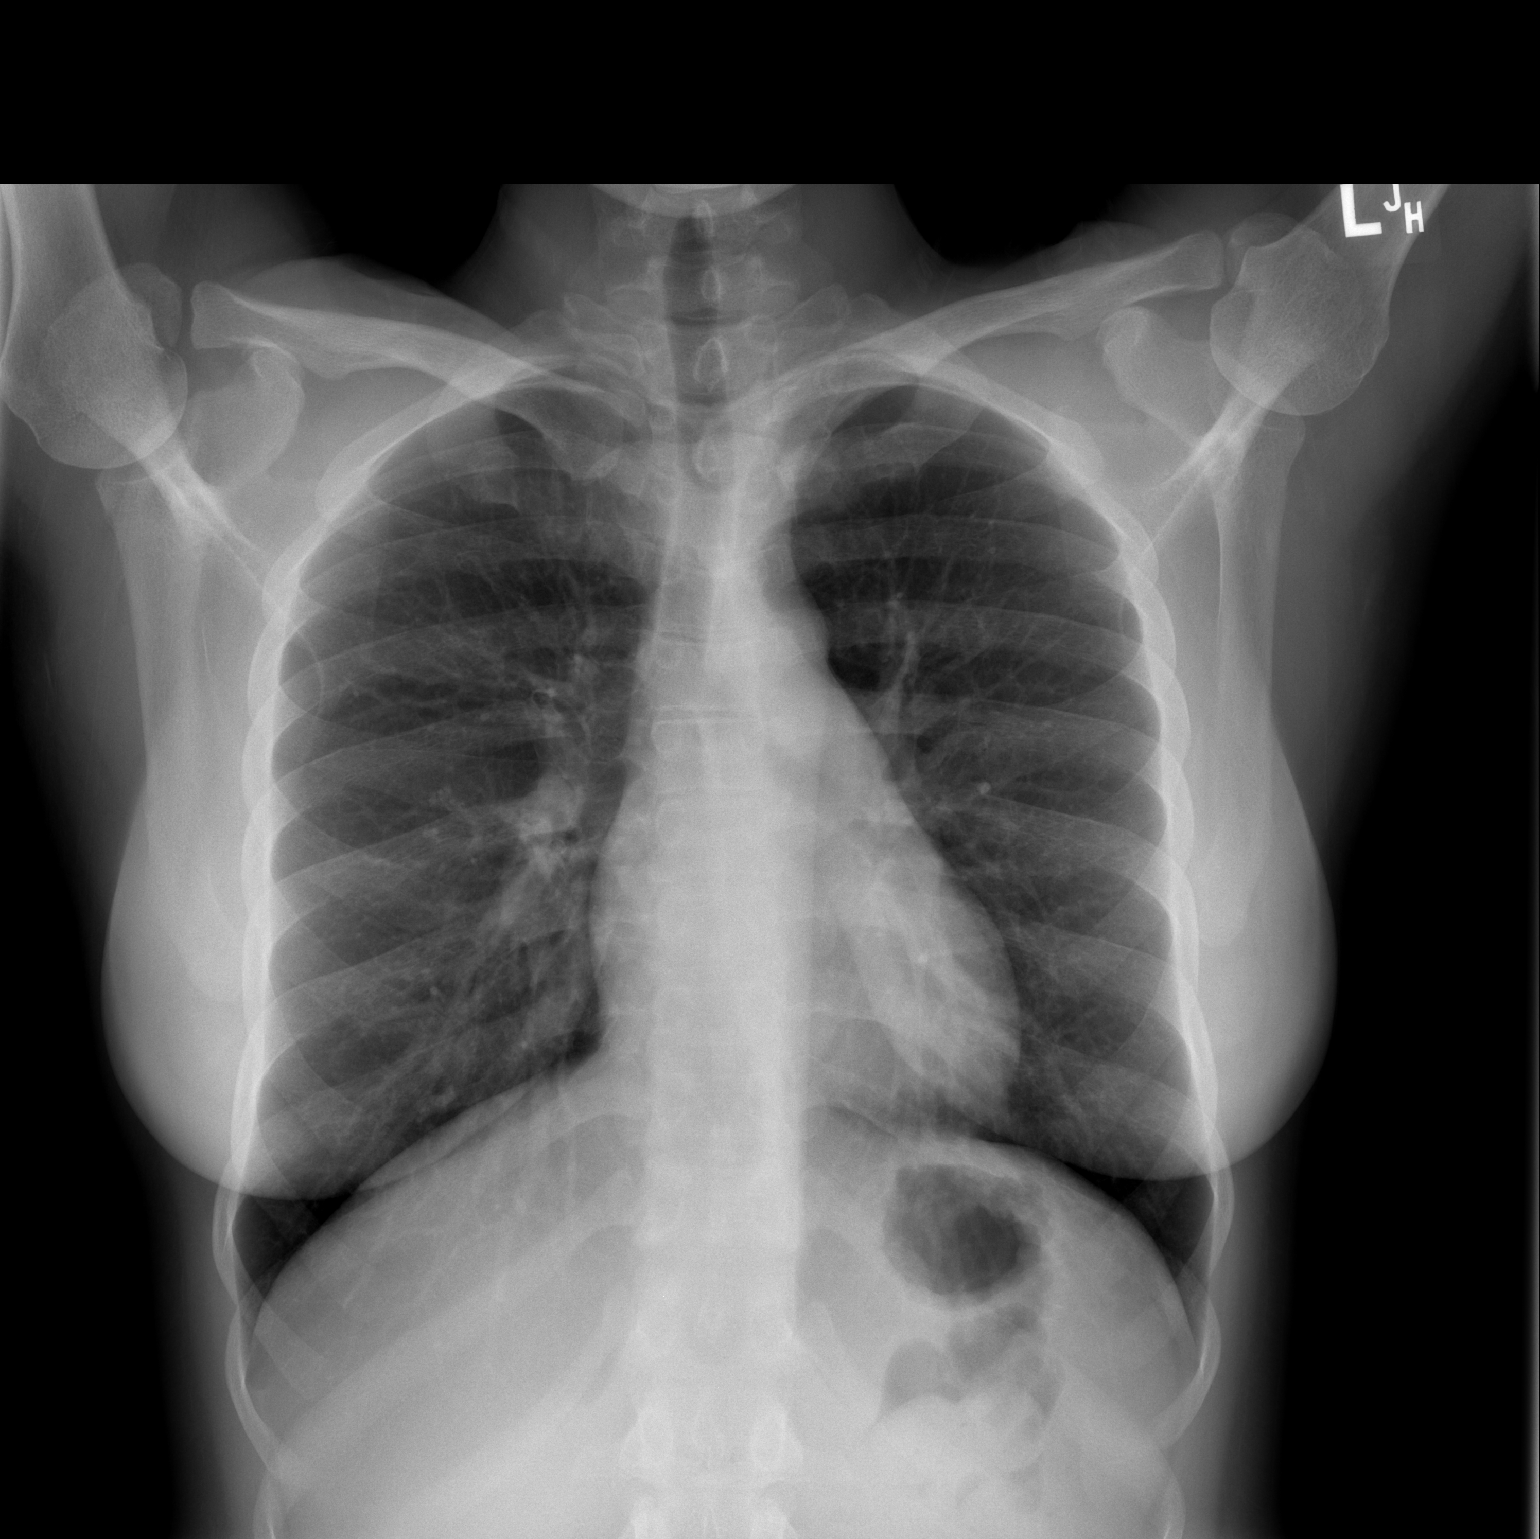

[w chest lat]
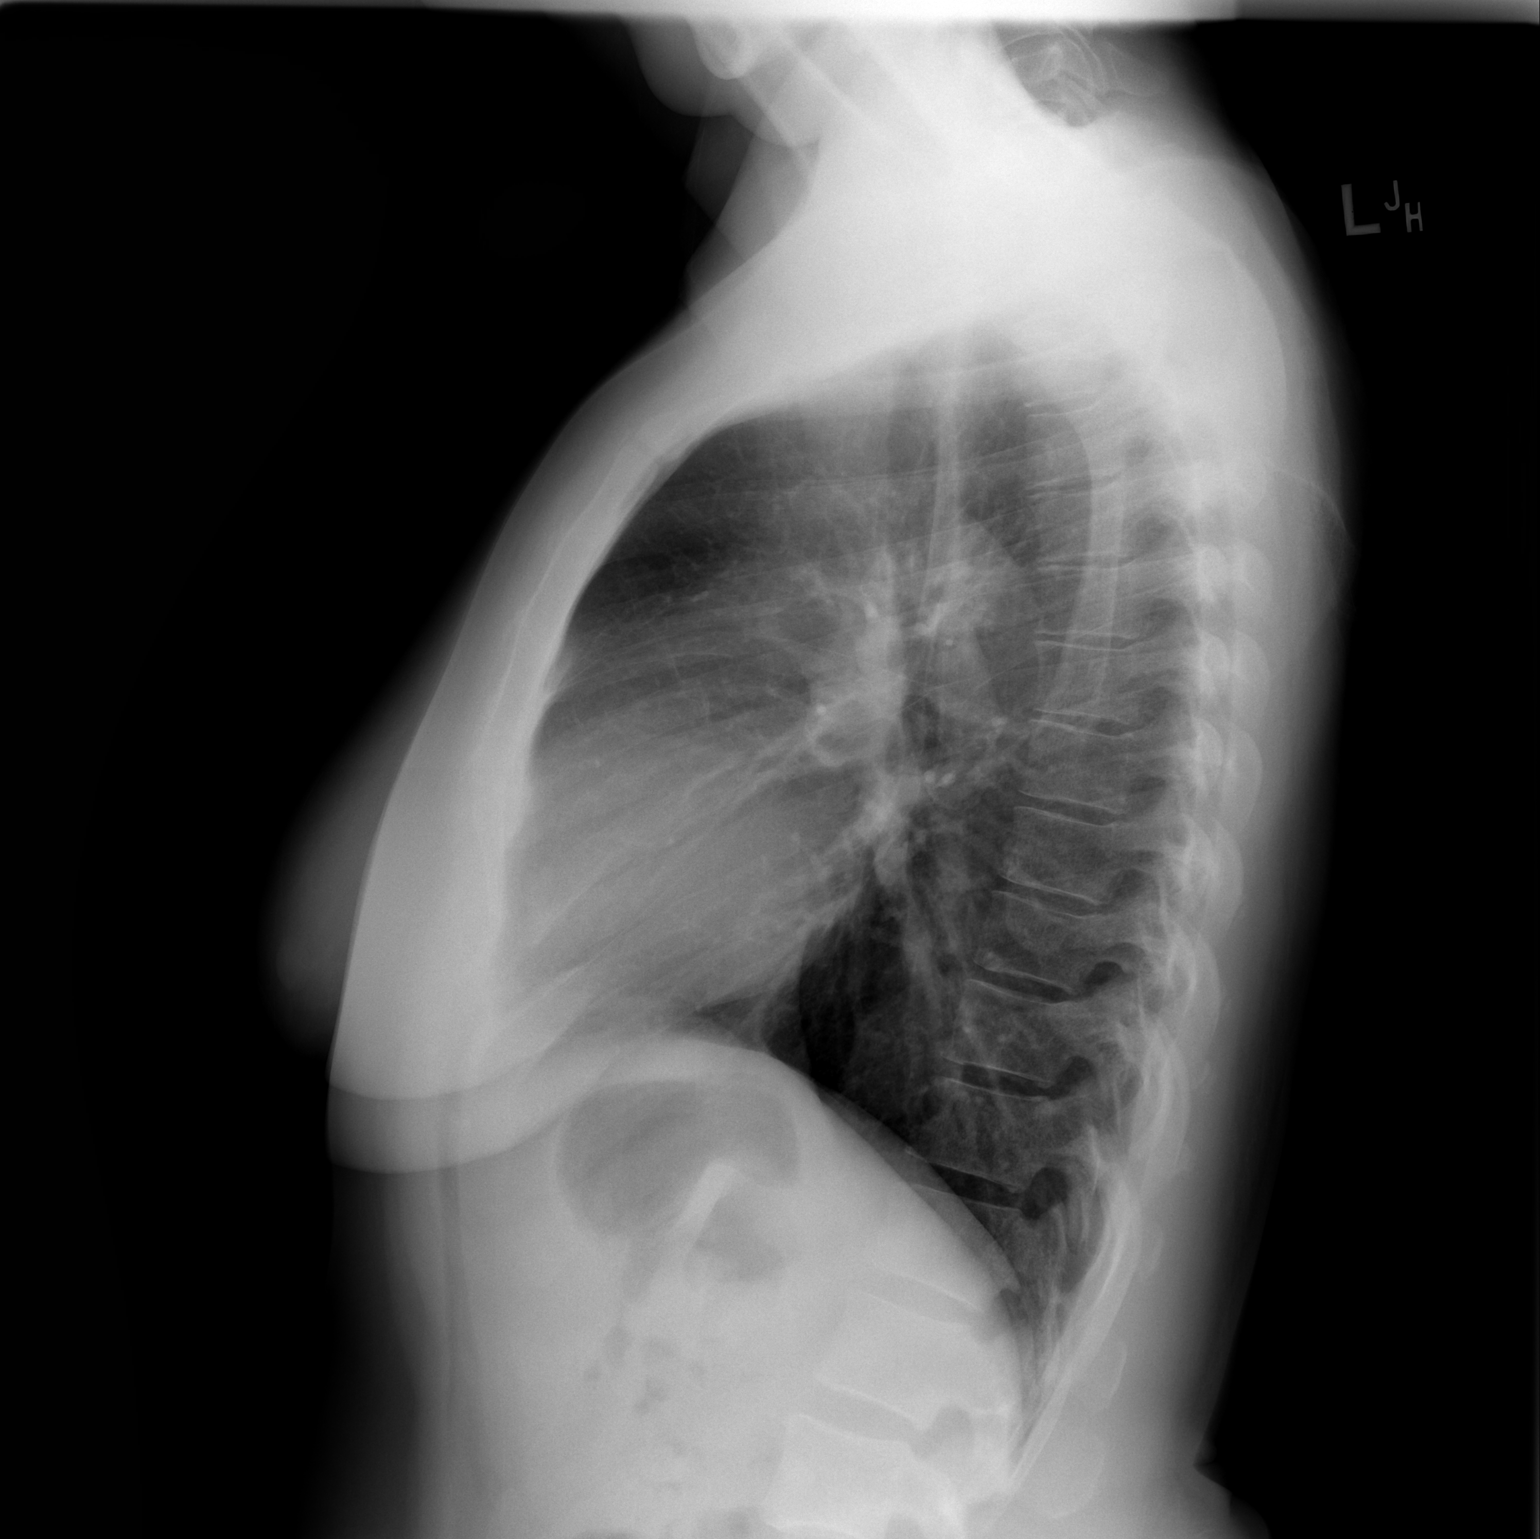

[2 of 2 positions shown; findings below may reference images not displayed]

FINDINGS: The heart size and mediastinal contours are within normal limits.
Both lungs are clear. The visualized skeletal structures are
unremarkable.
IMPRESSION: No active cardiopulmonary disease.

## 2016-07-19 ENCOUNTER — Other Ambulatory Visit: Payer: Self-pay | Admitting: Internal Medicine

## 2016-09-22 ENCOUNTER — Ambulatory Visit: Payer: BC Managed Care – PPO | Admitting: Internal Medicine

## 2016-11-10 ENCOUNTER — Other Ambulatory Visit: Payer: Self-pay | Admitting: Internal Medicine

## 2016-11-16 ENCOUNTER — Telehealth: Payer: Self-pay | Admitting: Internal Medicine

## 2016-11-16 ENCOUNTER — Telehealth: Payer: Self-pay

## 2016-11-16 NOTE — Telephone Encounter (Signed)
Called and notified patient.

## 2016-11-16 NOTE — Telephone Encounter (Signed)
Called and notified patient that we would do a lab test at her office visit.

## 2016-11-16 NOTE — Telephone Encounter (Signed)
Please advise. I know you normally do not do labs before.

## 2016-11-16 NOTE — Telephone Encounter (Signed)
Patient has a follow up appt. on 11/20/2016 (it had been rescheduled). Patient said Dr. Elvera LennoxGherghe usually has her labs done before her appointment. I did not see where labs had been scheduled previously for this f/u appt. Please advise patient.

## 2016-11-16 NOTE — Telephone Encounter (Signed)
I almost never do labs before the appt, but prefer to do them after

## 2016-11-20 ENCOUNTER — Encounter: Payer: Self-pay | Admitting: Internal Medicine

## 2016-11-20 ENCOUNTER — Ambulatory Visit (INDEPENDENT_AMBULATORY_CARE_PROVIDER_SITE_OTHER): Payer: BC Managed Care – PPO | Admitting: Internal Medicine

## 2016-11-20 VITALS — BP 128/82 | HR 65 | Ht 66.0 in | Wt 176.0 lb

## 2016-11-20 DIAGNOSIS — E0500 Thyrotoxicosis with diffuse goiter without thyrotoxic crisis or storm: Secondary | ICD-10-CM | POA: Diagnosis not present

## 2016-11-20 LAB — T4, FREE: Free T4: 0.72 ng/dL (ref 0.60–1.60)

## 2016-11-20 LAB — T3, FREE: T3, Free: 2.7 pg/mL (ref 2.3–4.2)

## 2016-11-20 LAB — TSH: TSH: 2.35 u[IU]/mL (ref 0.35–4.50)

## 2016-11-20 MED ORDER — ATENOLOL 50 MG PO TABS: ORAL_TABLET | ORAL | 3 refills | Status: DC

## 2016-11-20 NOTE — Patient Instructions (Addendum)
Please come back for labs 6 months.  Please continue Methimazole 5 mg daily.  Please stop at the lab.  Please return in 1 year.

## 2016-11-20 NOTE — Progress Notes (Signed)
Patient ID: Kathy [name], female   DOB: [birth year], 41 y.o.   MRN: [id number]   HPI  Kathy [name] is a 41 y.o.-year-old female, returning for f/u for Graves ds. Last visit 8 mo ago.  Reviewed and addended history: Patient was diagnosed with Graves' disease in 2015.   An uptake and scan (08/27/2013) confirmed Graves ds: 71% uptake (10-30%).  Patient was started on MMI 10 mg bid >> then decreased dose to 10 mg in am and 5 mg in pm. On this dose, levels were normal, but she had problems forgetting the doses >> TFTs worsened again >> dose increased to 20 mg daily >> 15 mg daily >> 10 mg daily >> 5 mg daily  in 09/2015. At that time, her TFTs were normal. Also, her TSI antibodies have improved to the normal range.  She is currently on 5 mg MMI daily. She missed few doses this week.  She is also on Atenolol 25 mg daily. She is now taking it prn for HTN.   Lab Results  Component Value Date   TSH 1.25 04/07/2016   TSH 2.56 09/24/2015   TSH 2.09 03/25/2015   TSH 0.37 02/04/2015   TSH 0.01 (L) 12/24/2014   TSH <0.008 (L) 06/04/2014   TSH 0.014 (L) 12/04/2013   TSH <0.008 (L) 10/13/2013   TSH <0.008 (L) 07/10/2013   FREET4 0.91 04/07/2016   FREET4 0.79 09/24/2015   FREET4 0.58 (L) 03/25/2015   FREET4 0.58 (L) 02/04/2015   FREET4 1.98 (H) 12/24/2014   FREET4 1.33 06/04/2014   FREET4 1.42 12/04/2013   FREET4 0.72 (L) 10/13/2013   FREET4 5.35 (H) 07/10/2013   T3FREE 3.3 04/07/2016   T3FREE 2.6 09/24/2015   T3FREE 2.3 03/25/2015   T3FREE 2.8 02/04/2015   T3FREE 5.9 (H) 12/24/2014   T3FREE 3.1 06/04/2014   T3FREE 3.5 12/04/2013   T3FREE 2.7 10/13/2013   T3FREE >20.0 (H) 07/10/2013  03/12/2014: TSH 0.17  07/08/2013: TSH <0.01 (0.34-5.6)  EKG: sinus tachycardia  WBC normal  Graves Abs: Lab Results  Component Value Date   TSI 116 09/24/2015   TSI 464 (H) 07/10/2013   Pt denies: - feeling nodules in neck - hoarseness - dysphagia - choking - SOB with lying down  She was  started on Progesterone. She has a Mirena >> but has sporadic spotting and hot flushes.  ROS: Constitutional: no weight gain/no weight loss, no fatigue, no subjective hyperthermia, no subjective hypothermia Eyes: no blurry vision, no xerophthalmia ENT: no sore throat, + see HPI Cardiovascular: no CP/no SOB/no palpitations/no leg swelling Respiratory: no cough/no SOB/no wheezing Gastrointestinal: no N/no V/no D/no C/no acid reflux Musculoskeletal: no muscle aches/no joint aches Skin: no rashes, no hair loss Neurological: no tremors/no numbness/no tingling/no dizziness  I reviewed pt's medications, allergies, PMH, social hx, family hx, and changes were documented in the history of present illness. Otherwise, unchanged from my initial visit note.  PE: BP 128/82   Pulse 65   Ht 5\' 6"  (1.676 m)   Wt 176 lb (79.8 kg)   SpO2 98%   BMI 28.41 kg/m  Body mass index is 28.41 kg/m. Wt Readings from Last 3 Encounters:  11/20/16 176 lb (79.8 kg)  03/23/16 171 lb (77.6 kg)  09/24/15 167 lb (75.8 kg)   Constitutional: overweight, in NAD Eyes: PERRLA, EOMI, no exophthalmos ENT: moist mucous membranes, no thyromegaly, no cervical lymphadenopathy Cardiovascular: RRR, No MRG Respiratory: CTA B Gastrointestinal: abdomen soft, NT, ND, BS+ Musculoskeletal: no deformities, strength  intact in all 4 Skin: moist, warm, no rashes Neurological: no tremor with outstretched hands, DTR normal in all 4  ASSESSMENT:  1. Graves ds.  PLAN:  1. Patient with a Graves ds, with several thyrotoxic symptoms initially: Heat intolerance, palpitations, anxiety, now all resolved on methimazole. She tells me that she occasionally has hot flashes at night for which she was started on progesterone. She has a history of medication noncompliance with methimazole if she was forgetting doses. 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 - We discussed about Continuing the methimazole versus RAI treatment or surgery and decided together to continue the methimazole. For now, continue 5 mg daily, but will check TFTs today and may need to adjust the dose afterwards. - She uses atenolol for blood pressure >> refilled - Discussed about the possibility of Graves ophthalmopathy but she denies exophthalmos or double vision for now - RTC in one year for a visit in 6 months for labs  Needs refills.  Component     Latest Ref Rng & Units 11/20/2016  TSH     0.35 - 4.50 uIU/mL 2.35  T4,Free(Direct)     0.60 - 1.60 ng/dL 1.610.72  Triiodothyronine,Free,Serum     2.3 - 4.2 pg/mL 2.7   TFTs normal and TSH is higher than before despite missing some MMI doses >> will try to decrease MMI dose to 2.5 mg daily and have her back for labs in 5 weeks.  Carlus Pavlovristina Dhruvan Gullion, MD PhD [facility]

## 2016-11-21 MED ORDER — METHIMAZOLE 5 MG PO TABS: 2.5000 mg | ORAL_TABLET | Freq: Every day | ORAL | 2 refills | Status: DC

## 2017-05-14 ENCOUNTER — Other Ambulatory Visit: Payer: Self-pay | Admitting: Internal Medicine

## 2017-05-21 ENCOUNTER — Other Ambulatory Visit (INDEPENDENT_AMBULATORY_CARE_PROVIDER_SITE_OTHER): Payer: BC Managed Care – PPO

## 2017-05-21 DIAGNOSIS — E0500 Thyrotoxicosis with diffuse goiter without thyrotoxic crisis or storm: Secondary | ICD-10-CM

## 2017-05-21 LAB — TSH: TSH: 0.82 u[IU]/mL (ref 0.35–4.50)

## 2017-05-21 LAB — T3, FREE: T3, Free: 3.5 pg/mL (ref 2.3–4.2)

## 2017-05-21 LAB — T4, FREE: Free T4: 0.78 ng/dL (ref 0.60–1.60)

## 2017-05-22 ENCOUNTER — Other Ambulatory Visit: Payer: Self-pay | Admitting: Internal Medicine

## 2017-05-22 DIAGNOSIS — E0500 Thyrotoxicosis with diffuse goiter without thyrotoxic crisis or storm: Secondary | ICD-10-CM

## 2017-09-06 ENCOUNTER — Ambulatory Visit
Admission: RE | Admit: 2017-09-06 | Discharge: 2017-09-06 | Disposition: A | Discharge status: Home / Self Care | Payer: BC Managed Care – PPO | Source: Ambulatory Visit | Attending: Physician Assistant | Admitting: Physician Assistant

## 2017-09-06 ENCOUNTER — Other Ambulatory Visit: Payer: Self-pay | Admitting: Physician Assistant

## 2017-09-06 DIAGNOSIS — K5900 Constipation, unspecified: Secondary | ICD-10-CM

## 2017-09-06 DIAGNOSIS — R52 Pain, unspecified: Secondary | ICD-10-CM

## 2017-11-19 ENCOUNTER — Encounter: Payer: Self-pay | Admitting: Internal Medicine

## 2017-11-19 ENCOUNTER — Ambulatory Visit (INDEPENDENT_AMBULATORY_CARE_PROVIDER_SITE_OTHER): Payer: BC Managed Care – PPO | Admitting: Internal Medicine

## 2017-11-19 VITALS — BP 128/80 | HR 67 | Ht 66.0 in | Wt 181.0 lb

## 2017-11-19 DIAGNOSIS — E0500 Thyrotoxicosis with diffuse goiter without thyrotoxic crisis or storm: Secondary | ICD-10-CM | POA: Diagnosis not present

## 2017-11-19 LAB — T3, FREE: T3 FREE: 2.5 pg/mL (ref 2.3–4.2)

## 2017-11-19 LAB — T4, FREE: FREE T4: 0.78 ng/dL (ref 0.60–1.60)

## 2017-11-19 LAB — TSH: TSH: 0.7 u[IU]/mL (ref 0.35–4.50)

## 2017-11-19 MED ORDER — METHIMAZOLE 5 MG PO TABS: 2.5000 mg | ORAL_TABLET | Freq: Every day | ORAL | 3 refills | Status: DC

## 2017-11-19 NOTE — Patient Instructions (Signed)
Please continue: - Methimazole 2.5 mg daily  Please stop at the lab.  Please come back for a follow-up appointment in 1 year, but for labs 6 mo for labs.

## 2017-11-19 NOTE — Progress Notes (Signed)
Patient ID: Kathy [name], female   DOB: 1975-05-12, 42 y.o.   MRN: [id number]   HPI  Kathy [name] is a 42 y.o.-year-old female, returning for f/u for Graves ds. Last visit 1 year ago.  Reviewed and addended history: Patient was diagnosed with Graves' disease in 2015.   An uptake and scan (08/27/2013) confirmed Graves' disease: 71% uptake (10-30%).  Patient was initially started on MMI 10 mg bid >> then decreased dose to 10 mg in am and 5 mg in pm. On this dose, levels were normal, but she had problems forgetting the doses >> TFTs worsened again >> dose increased to 20 mg daily >> 15 mg daily >> 10 mg daily >> 5 mg daily  in 09/2015.   At last visit, she was on 5 mg daily but was missing doses.  We decreased the dose to 2.5 mg daily.  She continues on atenolol 25 mg as needed for hypertension.  Reviewed her TFTs:  Lab Results  Component Value Date   TSH 0.82 05/21/2017   TSH 2.35 11/20/2016   TSH 1.25 04/07/2016   TSH 2.56 09/24/2015   TSH 2.09 03/25/2015   TSH 0.37 02/04/2015   TSH 0.01 (L) 12/24/2014   TSH <0.008 (L) 06/04/2014   TSH 0.014 (L) 12/04/2013   TSH <0.008 (L) 10/13/2013   FREET4 0.78 05/21/2017   FREET4 0.72 11/20/2016   FREET4 0.91 04/07/2016   FREET4 0.79 09/24/2015   FREET4 0.58 (L) 03/25/2015   FREET4 0.58 (L) 02/04/2015   FREET4 1.98 (H) 12/24/2014   FREET4 1.33 06/04/2014   FREET4 1.42 12/04/2013   FREET4 0.72 (L) 10/13/2013   T3FREE 3.5 05/21/2017   T3FREE 2.7 11/20/2016   T3FREE 3.3 04/07/2016   T3FREE 2.6 09/24/2015   T3FREE 2.3 03/25/2015   T3FREE 2.8 02/04/2015   T3FREE 5.9 (H) 12/24/2014   T3FREE 3.1 06/04/2014   T3FREE 3.5 12/04/2013   T3FREE 2.7 10/13/2013  03/12/2014: TSH 0.17  07/08/2013: TSH <0.01 (0.34-5.6)  EKG: sinus tachycardia  WBC normal  Her TSI antibodies decreased significantly at last check: Lab Results  Component Value Date   TSI 116 09/24/2015   TSI 464 (H) 07/10/2013   Pt denies: - feeling nodules in neck -  hoarseness - dysphagia - choking - SOB with lying down  She was started on Progesterone. She has a Mirena >> sporadic spotting it hot flashes.  ROS: Constitutional: +weight gain/no weight loss, no fatigue, no subjective hyperthermia, no subjective hypothermia Eyes: no blurry vision, no xerophthalmia ENT: no sore throat, + see HPI Cardiovascular: no CP/no SOB/no palpitations/no leg swelling Respiratory: no cough/no SOB/no wheezing Gastrointestinal: no N/no V/no D/no C/no acid reflux Musculoskeletal: no muscle aches/no joint aches Skin: no rashes, no hair loss Neurological: no tremors/no numbness/no tingling/no dizziness  I reviewed pt's medications, allergies, PMH, social hx, family hx, and changes were documented in the history of present illness. Otherwise, unchanged from my initial visit note.  Past Medical History:  Diagnosis Date  . Hypertension   . Hyperthyroidism    Past Surgical History:  Procedure Laterality Date  . OVARIAN CYST REMOVAL    . TOOTH EXTRACTION Bilateral    Social History   Socioeconomic History  . Marital status: Married    Spouse name: Not on file  . Number of children: Not on file  . Years of education: Not on file  . Highest education level: Not on file  Occupational History  . Not on file  Social Needs  .  Financial resource strain: Not on file  . Food insecurity:    Worry: Not on file    Inability: Not on file  . Transportation needs:    Medical: Not on file    Non-medical: Not on file  Tobacco Use  . Smoking status: Never Smoker  . Smokeless tobacco: Never Used  Substance and Sexual Activity  . Alcohol use: No  . Drug use: No  . Sexual activity: Yes    Partners: Male    Birth control/protection: IUD    Comment: tubal ligation  Lifestyle  . Physical activity:    Days per week: Not on file    Minutes per session: Not on file  . Stress: Not on file  Relationships  . Social connections:    Talks on phone: Not on file    Gets  together: Not on file    Attends religious service: Not on file    Active member of club or organization: Not on file    Attends meetings of clubs or organizations: Not on file    Relationship status: Not on file  . Intimate partner violence:    Fear of current or ex partner: Not on file    Emotionally abused: Not on file    Physically abused: Not on file    Forced sexual activity: Not on file  Other Topics Concern  . Not on file  Social History Narrative  . Not on file   Current Outpatient Medications on File Prior to Visit  Medication Sig Dispense Refill  . atenolol (TENORMIN) 50 MG tablet TAKE 0.5 TABLET (25 MG TOTAL) BY MOUTH DAILY. 25 MG ON BACKORDER 30 tablet 3  . Fish Oil-Cholecalciferol (FISH OIL/D3 ADULT GUMMIES PO) Take by mouth.    Marland Kitchen ibuprofen (ADVIL,MOTRIN) 200 MG tablet Take 600 mg by mouth every 6 (six) hours as needed.    Marland Kitchen lisinopril (PRINIVIL,ZESTRIL) 10 MG tablet Take 10 mg by mouth as needed.     . methimazole (TAPAZOLE) 5 MG tablet Take 0.5 tablets (2.5 mg total) daily by mouth. 60 tablet 2  . methimazole (TAPAZOLE) 5 MG tablet TAKE 1 TABLET BY MOUTH EVERY DAY 60 tablet 2  . predniSONE (DELTASONE) 20 MG tablet     . progesterone (PROMETRIUM) 200 MG capsule     . venlafaxine XR (EFFEXOR-XR) 75 MG 24 hr capsule Take 1 capsule (75 mg total) by mouth daily. 30 capsule 11   No current facility-administered medications on file prior to visit.    No Known Allergies Family History  Problem Relation Age of Onset  . Hypertension Mother   . Cancer Mother   . Cancer Father   . Cancer Maternal Grandmother    She is 5th grade teacher.  PE: BP 128/80   Pulse 67   Ht 5\' 6"  (1.676 m) Comment: measured  Wt 181 lb (82.1 kg)   SpO2 99%   BMI 29.21 kg/m  Body mass index is 29.21 kg/m. Wt Readings from Last 3 Encounters:  11/19/17 181 lb (82.1 kg)  11/20/16 176 lb (79.8 kg)  03/23/16 171 lb (77.6 kg)   Constitutional: overweight, in NAD Eyes: PERRLA, EOMI, no  exophthalmos ENT: moist mucous membranes, no thyromegaly, no cervical lymphadenopathy Cardiovascular: RRR, No MRG Respiratory: CTA B Gastrointestinal: abdomen soft, NT, ND, BS+ Musculoskeletal: no deformities, strength intact in all 4 Skin: moist, warm, no rashes Neurological: no tremor with outstretched hands, DTR normal in all 4  ASSESSMENT:  1. Graves ds.  PLAN:  1.  Patient with history of Graves' disease, initially with several thyrotoxic symptoms: Heat intolerance, palpitations, anxiety, now all resolved on methimazole.  She continues to have occasional hot flashes at night for which she was started on progesterone before last visit.  She does have a history of incomplete compliance with her methimazole and at last visit she described that she was forgetting doses.  However, even in this case, her TSH is increased to higher than 2, after which I advised her to decrease the dose of methimazole to 2.5 mg daily.  She tolerates this well and has no new symptoms.  She is worried about her weight gain, but she is aware that this is most likely not related to her thyroid disease.  We continued her atenolol which she takes as needed for hypertension. -We again discussed about continuing methimazole versus RAI treatment or surgery but she responds well to methimazole so will stay on this. -We will recheck a TSH, free T3 and free T4 -Continue atenolol for blood pressure -No signs of Graves' ophthalmopathy: Exophthalmos, double vision, chemosis -I will see her back in 1 year but in 6 months for labs  Needs refills.  - time spent with the patient: 15 minutes, of which >50% was spent in obtaining information about her symptoms, reviewing her previous labs, evaluations, and treatments, counseling her about her condition (please see the discussed topics above), and developing a plan to further investigate and treat it.  Office Visit on 11/19/2017  Component Date Value Ref Range Status  . TSH  11/19/2017 0.70  0.35 - 4.50 uIU/mL Final  . Free T4 11/19/2017 0.78  0.60 - 1.60 ng/dL Final   Comment: Specimens from patients who are undergoing biotin therapy and /or ingesting biotin supplements may contain high levels of biotin.  The higher biotin concentration in these specimens interferes with this Free T4 assay.  Specimens that contain high levels  of biotin may cause false high results for this Free T4 assay.  Please interpret results in light of the total clinical presentation of the patient.    . T3, Free 11/19/2017 2.5  2.3 - 4.2 pg/mL Final   Normal TFTs >> continue same dose MMI.  Carlus Pavlov, MD PhD [facility]

## 2018-03-03 ENCOUNTER — Other Ambulatory Visit: Payer: Self-pay | Admitting: Internal Medicine

## 2018-05-21 ENCOUNTER — Other Ambulatory Visit (INDEPENDENT_AMBULATORY_CARE_PROVIDER_SITE_OTHER): Payer: BC Managed Care – PPO

## 2018-05-21 ENCOUNTER — Other Ambulatory Visit: Payer: Self-pay

## 2018-05-21 DIAGNOSIS — E0500 Thyrotoxicosis with diffuse goiter without thyrotoxic crisis or storm: Secondary | ICD-10-CM | POA: Diagnosis not present

## 2018-05-21 LAB — T4, FREE: Free T4: 0.92 ng/dL (ref 0.60–1.60)

## 2018-05-21 LAB — TSH: TSH: 0.78 u[IU]/mL (ref 0.35–4.50)

## 2018-05-21 LAB — T3, FREE: T3, Free: 2.9 pg/mL (ref 2.3–4.2)

## 2018-05-22 ENCOUNTER — Other Ambulatory Visit: Payer: Self-pay | Admitting: Internal Medicine

## 2018-05-22 DIAGNOSIS — E0500 Thyrotoxicosis with diffuse goiter without thyrotoxic crisis or storm: Secondary | ICD-10-CM

## 2018-10-10 ENCOUNTER — Other Ambulatory Visit: Payer: Self-pay | Admitting: Internal Medicine

## 2018-11-21 ENCOUNTER — Other Ambulatory Visit: Payer: Self-pay

## 2018-11-21 ENCOUNTER — Ambulatory Visit (INDEPENDENT_AMBULATORY_CARE_PROVIDER_SITE_OTHER): Payer: BC Managed Care – PPO | Admitting: Internal Medicine

## 2018-11-21 ENCOUNTER — Encounter: Payer: Self-pay | Admitting: Internal Medicine

## 2018-11-21 DIAGNOSIS — E0500 Thyrotoxicosis with diffuse goiter without thyrotoxic crisis or storm: Secondary | ICD-10-CM

## 2018-11-21 LAB — T4, FREE: Free T4: 1.03 ng/dL (ref 0.60–1.60)

## 2018-11-21 LAB — TSH: TSH: 0.64 u[IU]/mL (ref 0.35–4.50)

## 2018-11-21 LAB — T3, FREE: T3, Free: 3.4 pg/mL (ref 2.3–4.2)

## 2018-11-21 NOTE — Progress Notes (Signed)
Patient ID: Kathy PollockLuciana T [name], female   DOB: 08/07/1975, 43 y.o.   MRN: [id number]  HPI  Kathy [name] is a 43 y.o.-year-old female, returning for f/u for Graves ds. Last visit 1 year ago.    Since last visit, she lost almost 20 pounds after she started to exercise more: Walking, biking.  Reviewed and addended history: Patient was diagnosed with Graves' disease in 2015.   An uptake and scan (08/27/2013) confirmed Graves' disease: 71% uptake (10-30%).  Patient was initially started on MMI 10 mg bid >> then decreased dose to 10 mg in am and 5 mg in pm. On this dose, levels were normal, but she had problems forgetting the doses >> TFTs worsened again >> dose increased to 20 mg daily >> 15 mg daily >> 10 mg daily >> 5 mg daily  in 09/2015.   At last visit, she was on 5 mg daily but was missing doses.  We decreased the dose to 2.5 mg daily. In 05/2018 we were able to stop the medication completely.  I advised her to come back for labs but she did not do so.  She continues atenolol 25 mg daily for hypertension.  Reviewed her TFTs: Lab Results  Component Value Date   TSH 0.78 05/21/2018   TSH 0.70 11/19/2017   TSH 0.82 05/21/2017   TSH 2.35 11/20/2016   TSH 1.25 04/07/2016   TSH 2.56 09/24/2015   TSH 2.09 03/25/2015   TSH 0.37 02/04/2015   TSH 0.01 (L) 12/24/2014   TSH <0.008 (L) 06/04/2014   FREET4 0.92 05/21/2018   FREET4 0.78 11/19/2017   FREET4 0.78 05/21/2017   FREET4 0.72 11/20/2016   FREET4 0.91 04/07/2016   FREET4 0.79 09/24/2015   FREET4 0.58 (L) 03/25/2015   FREET4 0.58 (L) 02/04/2015   FREET4 1.98 (H) 12/24/2014   FREET4 1.33 06/04/2014   T3FREE 2.9 05/21/2018   T3FREE 2.5 11/19/2017   T3FREE 3.5 05/21/2017   T3FREE 2.7 11/20/2016   T3FREE 3.3 04/07/2016   T3FREE 2.6 09/24/2015   T3FREE 2.3 03/25/2015   T3FREE 2.8 02/04/2015   T3FREE 5.9 (H) 12/24/2014   T3FREE 3.1 06/04/2014  03/12/2014: TSH 0.17  07/08/2013: TSH <0.01 (0.34-5.6)  EKG: sinus tachycardia  WBC  normal  Her TSI antibodies decreased significantly at last check: Lab Results  Component Value Date   TSI 116 09/24/2015   TSI 464 (H) 07/10/2013   Pt denies: - feeling nodules in neck - hoarseness - dysphagia - choking - SOB with lying down  No FH of thyroid cancer. No h/o radiation tx to head or neck.  No seaweed or kelp. No recent contrast studies. No herbal supplements. No Biotin use. No recent steroids use.    She has a Mirena IUD.  She   ROS: Constitutional: no weight gain/+ weight loss, no fatigue, no subjective hyperthermia, no subjective hypothermia Eyes: no blurry vision, no xerophthalmia ENT: no sore throat, + see HPI Cardiovascular: no CP/no SOB/no palpitations/no leg swelling Respiratory: no cough/no SOB/no wheezing Gastrointestinal: no N/no V/no D/no C/no acid reflux Musculoskeletal: no muscle aches/no joint aches Skin: no rashes, no hair loss Neurological: no tremors/no numbness/no tingling/no dizziness  I reviewed pt's medications, allergies, PMH, social hx, family hx, and changes were documented in the history of present illness. Otherwise, unchanged from my initial visit note.  Past Medical History:  Diagnosis Date  . Hypertension   . Hyperthyroidism    Past Surgical History:  Procedure Laterality Date  . OVARIAN CYST  REMOVAL    . TOOTH EXTRACTION Bilateral    Social History   Socioeconomic History  . Marital status: Married    Spouse name: Not on file  . Number of children: Not on file  . Years of education: Not on file  . Highest education level: Not on file  Occupational History  . Not on file  Social Needs  . Financial resource strain: Not on file  . Food insecurity    Worry: Not on file    Inability: Not on file  . Transportation needs    Medical: Not on file    Non-medical: Not on file  Tobacco Use  . Smoking status: Never Smoker  . Smokeless tobacco: Never Used  Substance and Sexual Activity  . Alcohol use: No  . Drug use:  No  . Sexual activity: Yes    Partners: Male    Birth control/protection: I.U.D.    Comment: tubal ligation  Lifestyle  . Physical activity    Days per week: Not on file    Minutes per session: Not on file  . Stress: Not on file  Relationships  . Social Musician on phone: Not on file    Gets together: Not on file    Attends religious service: Not on file    Active member of club or organization: Not on file    Attends meetings of clubs or organizations: Not on file    Relationship status: Not on file  . Intimate partner violence    Fear of current or ex partner: Not on file    Emotionally abused: Not on file    Physically abused: Not on file    Forced sexual activity: Not on file  Other Topics Concern  . Not on file  Social History Narrative  . Not on file   Current Outpatient Medications on File Prior to Visit  Medication Sig Dispense Refill  . atenolol (TENORMIN) 50 MG tablet TAKE 0.5 TABLET (25 MG TOTAL) BY MOUTH DAILY. 25 MG ON BACKORDER 30 tablet 3  . Fish Oil-Cholecalciferol (FISH OIL/D3 ADULT GUMMIES PO) Take by mouth.    Marland Kitchen ibuprofen (ADVIL,MOTRIN) 200 MG tablet Take 600 mg by mouth every 6 (six) hours as needed.    Marland Kitchen lisinopril (PRINIVIL,ZESTRIL) 10 MG tablet Take 10 mg by mouth as needed.     . predniSONE (DELTASONE) 20 MG tablet     . progesterone (PROMETRIUM) 200 MG capsule     . venlafaxine XR (EFFEXOR-XR) 75 MG 24 hr capsule Take 1 capsule (75 mg total) by mouth daily. 30 capsule 11   No current facility-administered medications on file prior to visit.    No Known Allergies Family History  Problem Relation Age of Onset  . Hypertension Mother   . Cancer Mother   . Cancer Father   . Cancer Maternal Grandmother    She is 5th grade teacher.  PE: BP 110/70   Pulse (!) 58   Ht 5' 5.5" (1.664 m) Comment: measured today without shoes  Wt 163 lb (73.9 kg)   SpO2 98%   BMI 26.71 kg/m  Body mass index is 26.71 kg/m. Wt Readings from Last 3  Encounters:  11/21/18 163 lb (73.9 kg)  11/19/17 181 lb (82.1 kg)  11/20/16 176 lb (79.8 kg)   Constitutional: overweight, in NAD Eyes: PERRLA, EOMI, no exophthalmos ENT: moist mucous membranes, no thyromegaly, no cervical lymphadenopathy Cardiovascular: RRR, No MRG Respiratory: CTA B Gastrointestinal: abdomen soft, NT, ND,  BS+ Musculoskeletal: no deformities, strength intact in all 4 Skin: moist, warm, no rashes Neurological: no tremor with outstretched hands, DTR normal in all 4  ASSESSMENT:  1. Graves ds.  PLAN:  1. Patient with history of Graves' disease, initially with several thyrotoxic symptoms: Heat intolerance, palpitations, anxiety, then all resolved on methimazole.  She continued to have occasional hot flashes at night for which she started progesterone.  She does had a history of incomplete compliance with methimazole and she was forgetting doses in the past.  However, we were able to decrease the dose to 2.5 mg daily and we ended up stopping the medication completely in 05/2018.  I advised him to return for labs in 5 weeks, but she did not do so.  We discussed about the importance of coming back for thyroid labs as advised. -At this visit, she has no complaints. She lost almost 20 lbs in last year - walking, biking.  I congratulated her. -continue Atenolol, which is actually used for hypertension.  Pulse today is 58, so I advised her to discuss with PCP, may be she can reduce the atenolol dose.  We do not need this necessarily for her Graves' disease, which appears to be under control.  -For now, we will recheck her TSH, free T4, and free T3 and see if she can continue to stay off methimazole. -occasionally after stopping methimazole, patients with Graves' disease may develop hypothyroidism.  In this case, we may need to start levothyroxine -At this visit, she has no signs of Graves' ophthalmopathy: No exophthalmos, double vision, chemosis -I will see her back in 6 months for  labs in 1 year for follow-up visit  - time spent with the patient: 15 min, of which >50% was spent in obtaining information about her symptoms, reviewing her previous labs, evaluations, and treatments, counseling her about her condition (please see the discussed topics above), and developing a plan to further investigate and treat it; she had a number of questions which I addressed.  Office Visit on 11/21/2018  Component Date Value Ref Range Status  . T3, Free 11/21/2018 3.4  2.3 - 4.2 pg/mL Final  . Free T4 11/21/2018 1.03  0.60 - 1.60 ng/dL Final   Comment: Specimens from patients who are undergoing biotin therapy and /or ingesting biotin supplements may contain high levels of biotin.  The higher biotin concentration in these specimens interferes with this Free T4 assay.  Specimens that contain high levels  of biotin may cause false high results for this Free T4 assay.  Please interpret results in light of the total clinical presentation of the patient.    Marland Kitchen TSH 11/21/2018 0.64  0.35 - 4.50 uIU/mL Final   TFTs are normal.  We will repeat them in 3 months.  Philemon Kingdom, MD PhD [facility] Endocrin[facility]

## 2018-11-21 NOTE — Patient Instructions (Signed)
Please continue off methimazole.  Please stop at the lab.  Please come back for a follow-up appointment in 1 year, but for labs 6 mo for labs.  

## 2019-01-01 ENCOUNTER — Other Ambulatory Visit: Payer: Self-pay | Admitting: Obstetrics and Gynecology

## 2019-01-01 DIAGNOSIS — R928 Other abnormal and inconclusive findings on diagnostic imaging of breast: Secondary | ICD-10-CM

## 2019-01-16 ENCOUNTER — Ambulatory Visit
Admission: RE | Admit: 2019-01-16 | Discharge: 2019-01-16 | Disposition: A | Discharge status: Home / Self Care | Payer: BC Managed Care – PPO | Source: Ambulatory Visit | Attending: Obstetrics and Gynecology | Admitting: Obstetrics and Gynecology

## 2019-01-16 ENCOUNTER — Other Ambulatory Visit: Payer: Self-pay | Admitting: Obstetrics and Gynecology

## 2019-01-16 ENCOUNTER — Other Ambulatory Visit: Payer: Self-pay

## 2019-01-16 DIAGNOSIS — R928 Other abnormal and inconclusive findings on diagnostic imaging of breast: Secondary | ICD-10-CM

## 2019-01-22 ENCOUNTER — Other Ambulatory Visit: Payer: Self-pay

## 2019-01-22 ENCOUNTER — Ambulatory Visit
Admission: RE | Admit: 2019-01-22 | Discharge: 2019-01-22 | Disposition: A | Discharge status: Home / Self Care | Payer: BC Managed Care – PPO | Source: Ambulatory Visit | Attending: Obstetrics and Gynecology | Admitting: Obstetrics and Gynecology

## 2019-01-22 DIAGNOSIS — R928 Other abnormal and inconclusive findings on diagnostic imaging of breast: Secondary | ICD-10-CM

## 2019-05-21 ENCOUNTER — Other Ambulatory Visit: Payer: BC Managed Care – PPO

## 2019-06-03 ENCOUNTER — Other Ambulatory Visit: Payer: Self-pay

## 2019-06-03 ENCOUNTER — Other Ambulatory Visit (INDEPENDENT_AMBULATORY_CARE_PROVIDER_SITE_OTHER): Payer: BC Managed Care – PPO

## 2019-06-03 DIAGNOSIS — E0500 Thyrotoxicosis with diffuse goiter without thyrotoxic crisis or storm: Secondary | ICD-10-CM | POA: Diagnosis not present

## 2019-06-03 LAB — T3, FREE: T3, Free: 3.1 pg/mL (ref 2.3–4.2)

## 2019-06-03 LAB — T4, FREE: Free T4: 1.08 ng/dL (ref 0.60–1.60)

## 2019-06-03 LAB — TSH: TSH: 1.11 u[IU]/mL (ref 0.35–4.50)

## 2019-11-06 ENCOUNTER — Other Ambulatory Visit: Payer: Self-pay

## 2019-11-06 ENCOUNTER — Encounter (HOSPITAL_COMMUNITY): Payer: Self-pay | Admitting: *Deleted

## 2019-11-06 ENCOUNTER — Emergency Department (HOSPITAL_COMMUNITY)
Admission: EM | Admit: 2019-11-06 | Discharge: 2019-11-07 | Disposition: A | Discharge status: Home / Self Care | Payer: BC Managed Care – PPO | Attending: Emergency Medicine | Admitting: Emergency Medicine

## 2019-11-06 DIAGNOSIS — M549 Dorsalgia, unspecified: Secondary | ICD-10-CM | POA: Insufficient documentation

## 2019-11-06 DIAGNOSIS — Y92410 Unspecified street and highway as the place of occurrence of the external cause: Secondary | ICD-10-CM | POA: Insufficient documentation

## 2019-11-06 DIAGNOSIS — Z5321 Procedure and treatment not carried out due to patient leaving prior to being seen by health care provider: Secondary | ICD-10-CM | POA: Insufficient documentation
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
## 2019-11-06 NOTE — ED Notes (Signed)
Pt states her kids are in the car and she does not want to keep them waiting out there so she will just see her PCP in the morning

## 2019-11-26 ENCOUNTER — Other Ambulatory Visit: Payer: Self-pay

## 2019-11-26 ENCOUNTER — Encounter: Payer: Self-pay | Admitting: Internal Medicine

## 2019-11-26 ENCOUNTER — Ambulatory Visit: Payer: BC Managed Care – PPO | Admitting: Internal Medicine

## 2019-11-26 VITALS — BP 140/92 | HR 84 | Ht 65.0 in | Wt 166.6 lb

## 2019-11-26 DIAGNOSIS — E0500 Thyrotoxicosis with diffuse goiter without thyrotoxic crisis or storm: Secondary | ICD-10-CM

## 2019-11-26 LAB — T3, FREE: T3, Free: 2.7 pg/mL (ref 2.3–4.2)

## 2019-11-26 LAB — T4, FREE: Free T4: 0.94 ng/dL (ref 0.60–1.60)

## 2019-11-26 LAB — TSH: TSH: 0.81 u[IU]/mL (ref 0.35–4.50)

## 2019-11-26 NOTE — Patient Instructions (Signed)
Please continue off methimazole.  Please stop at the lab.  Please come back for a follow-up appointment in 1 year, but for labs 6 mo for labs.
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
## 2020-05-26 ENCOUNTER — Other Ambulatory Visit (INDEPENDENT_AMBULATORY_CARE_PROVIDER_SITE_OTHER): Payer: BC Managed Care – PPO

## 2020-05-26 ENCOUNTER — Other Ambulatory Visit: Payer: Self-pay

## 2020-05-26 DIAGNOSIS — E0500 Thyrotoxicosis with diffuse goiter without thyrotoxic crisis or storm: Secondary | ICD-10-CM | POA: Diagnosis not present

## 2020-05-26 LAB — T3, FREE: T3, Free: 2.6 pg/mL (ref 2.3–4.2)

## 2020-05-26 LAB — T4, FREE: Free T4: 0.97 ng/dL (ref 0.60–1.60)

## 2020-05-26 LAB — TSH: TSH: 0.66 u[IU]/mL (ref 0.35–4.50)

## 2020-12-01 ENCOUNTER — Encounter: Payer: Self-pay | Admitting: Internal Medicine

## 2020-12-01 ENCOUNTER — Ambulatory Visit (INDEPENDENT_AMBULATORY_CARE_PROVIDER_SITE_OTHER): Payer: BC Managed Care – PPO | Admitting: Internal Medicine

## 2020-12-01 ENCOUNTER — Other Ambulatory Visit: Payer: Self-pay

## 2020-12-01 VITALS — BP 136/88 | HR 67 | Ht 65.0 in | Wt 169.0 lb

## 2020-12-01 DIAGNOSIS — E0500 Thyrotoxicosis with diffuse goiter without thyrotoxic crisis or storm: Secondary | ICD-10-CM | POA: Diagnosis not present

## 2020-12-01 LAB — TSH: TSH: 0.8 u[IU]/mL (ref 0.35–5.50)

## 2020-12-01 LAB — T3, FREE: T3, Free: 2.8 pg/mL (ref 2.3–4.2)

## 2020-12-01 LAB — T4, FREE: Free T4: 0.93 ng/dL (ref 0.60–1.60)

## 2020-12-01 NOTE — Patient Instructions (Addendum)
Please continue off methimazole.  Please stop at the lab.  Please come back for a follow-up appointment in 1 year. 
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
## 2021-07-02 ENCOUNTER — Encounter: Payer: Self-pay | Admitting: *Deleted

## 2021-07-02 LAB — GLUCOSE, POCT (MANUAL RESULT ENTRY): POC Glucose: 91 mg/dl (ref 70–99)

## 2021-11-30 ENCOUNTER — Encounter: Payer: Self-pay | Admitting: Internal Medicine

## 2021-11-30 ENCOUNTER — Ambulatory Visit (INDEPENDENT_AMBULATORY_CARE_PROVIDER_SITE_OTHER): Payer: BC Managed Care – PPO | Admitting: Internal Medicine

## 2021-11-30 VITALS — BP 128/86 | HR 84 | Ht 65.0 in | Wt 175.0 lb

## 2021-11-30 DIAGNOSIS — E0500 Thyrotoxicosis with diffuse goiter without thyrotoxic crisis or storm: Secondary | ICD-10-CM | POA: Diagnosis not present

## 2021-11-30 LAB — TSH: TSH: 0.01 u[IU]/mL — ABNORMAL LOW (ref 0.35–5.50)

## 2021-11-30 LAB — T4, FREE: Free T4: 0.93 ng/dL (ref 0.60–1.60)

## 2021-11-30 LAB — T3, FREE: T3, Free: 3.5 pg/mL (ref 2.3–4.2)

## 2021-11-30 MED ORDER — ATENOLOL 50 MG PO TABS: ORAL_TABLET | ORAL | 3 refills | Status: AC

## 2021-11-30 NOTE — Patient Instructions (Addendum)
Please continue off methimazole.  Please stop at the lab.  Please have thyroid labs checked annually with PCP (TSH, free T4, free T3) and return to see me if they are abnormal.

## 2021-11-30 NOTE — Progress Notes (Signed)
Patient ID: Kathy [name], female   DOB: 1975/01/19, 46 y.o.   MRN: [id number]  HPI  Kathy [name] is a 46 y.o.-year-old female, returning for f/u for Graves ds. Last visit 1 year ago.  Interim history: No tremors, palpitations, heat intolerance, weight loss. She has hot flashes at night.  Reviewed history: Patient was diagnosed with Graves' disease in 2015.   An uptake and scan (08/27/2013) confirmed Graves' disease: 71% uptake (10-30%).  Patient was initially started on MMI 10 mg bid >> then decreased dose to 10 mg in am and 5 mg in pm. On this dose, levels were normal, but she had problems forgetting the doses >> TFTs worsened again >> dose increased to 20 mg daily >> 15 mg daily >> 10 mg daily >> 5 mg daily  in 09/2015.   At last visit, she was on 5 mg daily but was missing doses.  We decreased the dose to 2.5 mg daily. In 05/2018 we were able to stop MMI completely.   She continues atenolol 25 mg daily, which is actually used for hypertension.  Reviewed her TFTs: Lab Results  Component Value Date   TSH 0.80 12/01/2020   TSH 0.66 05/26/2020   TSH 0.81 11/26/2019   TSH 1.11 06/03/2019   TSH 0.64 11/21/2018   TSH 0.78 05/21/2018   TSH 0.70 11/19/2017   TSH 0.82 05/21/2017   TSH 2.35 11/20/2016   TSH 1.25 04/07/2016   FREET4 0.93 12/01/2020   FREET4 0.97 05/26/2020   FREET4 0.94 11/26/2019   FREET4 1.08 06/03/2019   FREET4 1.03 11/21/2018   FREET4 0.92 05/21/2018   FREET4 0.78 11/19/2017   FREET4 0.78 05/21/2017   FREET4 0.72 11/20/2016   FREET4 0.91 04/07/2016   T3FREE 2.8 12/01/2020   T3FREE 2.6 05/26/2020   T3FREE 2.7 11/26/2019   T3FREE 3.1 06/03/2019   T3FREE 3.4 11/21/2018   T3FREE 2.9 05/21/2018   T3FREE 2.5 11/19/2017   T3FREE 3.5 05/21/2017   T3FREE 2.7 11/20/2016   T3FREE 3.3 04/07/2016  03/12/2014: TSH 0.17  07/08/2013: TSH <0.01 (0.34-5.6)  EKG: sinus tachycardia  WBC normal  Her TSI antibodies were high but they normalized at last check: Lab  Results  Component Value Date   TSI 116 09/24/2015   TSI 464 (H) 07/10/2013   Pt denies: - feeling nodules in neck - hoarseness - dysphagia - choking  No FH of thyroid cancer. No h/o radiation tx to head or neck. No recent contrast studies. No herbal supplements. No Biotin use. No recent steroids use.    She has a Mirena IUD.  ROS: + See HPI  I reviewed pt's medications, allergies, PMH, social hx, family hx, and changes were documented in the history of present illness. Otherwise, unchanged from my initial visit note.  Past Medical History:  Diagnosis Date   Hypertension    Hyperthyroidism    Past Surgical History:  Procedure Laterality Date   OVARIAN CYST REMOVAL     TOOTH EXTRACTION Bilateral    Social History   Socioeconomic History   Marital status: Married    Spouse name: Not on file   Number of children: Not on file   Years of education: Not on file   Highest education level: Not on file  Occupational History   Not on file  Tobacco Use   Smoking status: Never   Smokeless tobacco: Never  Substance and Sexual Activity   Alcohol use: No   Drug use: No   Sexual activity:  Yes    Partners: Male    Birth control/protection: I.U.D.    Comment: tubal ligation  Other Topics Concern   Not on file  Social History Narrative   Not on file   Social Determinants of Health   Financial Resource Strain: Not on file  Food Insecurity: Not on file  Transportation Needs: Not on file  Physical Activity: Not on file  Stress: Not on file  Social Connections: Not on file  Intimate Partner Violence: Not on file   Current Outpatient Medications on File Prior to Visit  Medication Sig Dispense Refill   atenolol (TENORMIN) 50 MG tablet TAKE 0.5 TABLET (25 MG TOTAL) BY MOUTH DAILY. 25 MG ON BACKORDER 30 tablet 3   ibuprofen (ADVIL,MOTRIN) 200 MG tablet Take 600 mg by mouth every 6 (six) hours as needed.     No current facility-administered medications on file prior to  visit.   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  PE: BP 128/86 (BP Location: Right Arm, Patient Position: Sitting, Cuff Size: Normal)   Pulse 84   Ht 5\' 5"  (1.651 m)   Wt 175 lb (79.4 kg)   SpO2 93%   BMI 29.12 kg/m   Wt Readings from Last 3 Encounters:  11/30/21 175 lb (79.4 kg)  12/01/20 169 lb (76.7 kg)  11/26/19 166 lb 9.6 oz (75.6 kg)   Constitutional: normal weight, in NAD Eyes:  EOMI, no exophthalmos ENT: no neck masses, no cervical lymphadenopathy Cardiovascular: RRR, No MRG Respiratory: CTA B Musculoskeletal: no deformities Skin:no rashes Neurological: no tremor with outstretched hands  ASSESSMENT:  1. Graves ds.  PLAN:  1. Patient with history of Graves' disease, initially with several thyrotoxic symptoms: Heat intolerance, palpitations, anxiety, pulm resolved on methimazole.  She had persistent hot flashes at night for which she started progesterone. -She has a history of incomplete compliance with methimazole in the past, forgetting doses, but we were able to decrease the dose and then stop the medication completely in 05/2018.   -Subsequent TFTs remained normal, including at last visit 1 year ago -At today's visit, she has no thyrotoxic signs or symptoms. She gained 6 lbs since last OV. -At today's visit we will recheck her TFTs and see if we need to restart methimazole afterwards -I will see her back on an as-needed basis.  We discussed about possibly continuing to see her PCP who can check her thyroid tests annually and refer her back to me if they become abnormal. -I was usually refilling her atenolol, but we are using this for blood pressure mostly so we discussed about switching the refill to PCP.  For now, I refilled this for her for the next year.  Component     Latest Ref Rng 11/30/2021  TSH     0.35 - 5.50  uIU/mL 0.01 Repeated and verified X2. (L)   Triiodothyronine,Free,Serum     2.3 - 4.2 pg/mL 3.5   T4,Free(Direct)     0.60 - 1.60 ng/dL 12/02/2021     TSH is undetectable, while free thyroid hormones are normal.  For now, especially since she is asymptomatic, I would like to repeat the test in approximately 4 weeks before suggesting restart of methimazole.   8.28, MD PhD [facility] Endocrinology

## 2021-12-30 ENCOUNTER — Emergency Department (HOSPITAL_COMMUNITY)
Admission: EM | Admit: 2021-12-30 | Discharge: 2021-12-30 | Discharge status: Against Medical Advice | Payer: BC Managed Care – PPO

## 2021-12-30 ENCOUNTER — Encounter (HOSPITAL_BASED_OUTPATIENT_CLINIC_OR_DEPARTMENT_OTHER): Payer: Self-pay | Admitting: Emergency Medicine

## 2021-12-30 ENCOUNTER — Emergency Department (HOSPITAL_BASED_OUTPATIENT_CLINIC_OR_DEPARTMENT_OTHER): Payer: BC Managed Care – PPO

## 2021-12-30 ENCOUNTER — Emergency Department (HOSPITAL_BASED_OUTPATIENT_CLINIC_OR_DEPARTMENT_OTHER)
Admission: EM | Admit: 2021-12-30 | Discharge: 2021-12-30 | Disposition: A | Discharge status: Home / Self Care | Payer: No Typology Code available for payment source | Attending: Emergency Medicine | Admitting: Emergency Medicine

## 2021-12-30 ENCOUNTER — Other Ambulatory Visit: Payer: Self-pay

## 2021-12-30 DIAGNOSIS — Z23 Encounter for immunization: Secondary | ICD-10-CM | POA: Diagnosis not present

## 2021-12-30 DIAGNOSIS — S61411A Laceration without foreign body of right hand, initial encounter: Secondary | ICD-10-CM | POA: Insufficient documentation

## 2021-12-30 DIAGNOSIS — W230XXA Caught, crushed, jammed, or pinched between moving objects, initial encounter: Secondary | ICD-10-CM | POA: Insufficient documentation

## 2021-12-30 DIAGNOSIS — S6991XA Unspecified injury of right wrist, hand and finger(s), initial encounter: Secondary | ICD-10-CM

## 2021-12-30 MED ORDER — ONDANSETRON 4 MG PO TBDP
8.0000 mg | ORAL_TABLET | Freq: Once | ORAL | Status: AC
  Administered 2021-12-30: 8 mg via ORAL
  Filled 2021-12-30: qty 2

## 2021-12-30 MED ORDER — ONDANSETRON 8 MG PO TBDP: 8.0000 mg | ORAL_TABLET | Freq: Three times a day (TID) | ORAL | 0 refills | Status: AC | PRN

## 2021-12-30 MED ORDER — OXYCODONE-ACETAMINOPHEN 5-325 MG PO TABS
1.0000 | ORAL_TABLET | Freq: Once | ORAL | Status: AC
  Administered 2021-12-30: 1 via ORAL
  Filled 2021-12-30: qty 1

## 2021-12-30 MED ORDER — TETANUS-DIPHTH-ACELL PERTUSSIS 5-2.5-18.5 LF-MCG/0.5 IM SUSY
0.5000 mL | PREFILLED_SYRINGE | Freq: Once | INTRAMUSCULAR | Status: AC
  Administered 2021-12-30: 0.5 mL via INTRAMUSCULAR
  Filled 2021-12-30: qty 0.5

## 2021-12-30 MED ORDER — LIDOCAINE-EPINEPHRINE-TETRACAINE (LET) TOPICAL GEL
3.0000 mL | Freq: Once | TOPICAL | Status: AC
  Administered 2021-12-30: 3 mL via TOPICAL
  Filled 2021-12-30: qty 3

## 2021-12-30 MED ORDER — LIDOCAINE HCL (PF) 1 % IJ SOLN
10.0000 mL | Freq: Once | INTRAMUSCULAR | Status: AC
  Administered 2021-12-30: 10 mL
  Filled 2021-12-30: qty 10

## 2021-12-30 MED ORDER — OXYCODONE-ACETAMINOPHEN 5-325 MG PO TABS: 1.0000 | ORAL_TABLET | Freq: Four times a day (QID) | ORAL | 0 refills | Status: AC | PRN

## 2021-12-30 NOTE — ED Notes (Signed)
ED Provider at bedside. 

## 2021-12-30 NOTE — Discharge Instructions (Addendum)
Have the sutures removed in about 7 to 10 days.  You will likely close in 10 days given the depth of the wound.  He can wash with soap and water, change dressing twice daily.  Return for fevers, pus, spreading redness or signs of infection.  Take the pain medicine as needed, take ibuprofen 800 mg 3 times daily after urine out of the narcotic.

## 2021-12-30 NOTE — ED Notes (Signed)
Pt told registration they were leaving 

## 2021-12-30 NOTE — ED Triage Notes (Addendum)
Pt POV reports pulling window and it slammed on right hand causing a laceration. Bleeding controlled at time of triage. Laceration dressed with saline soaked gauze, wrapped with pressure gauze.   CNS intact.

## 2021-12-30 NOTE — ED Provider Notes (Signed)
[facility] EMERGENCY DEPARTMENT Provider Note   CSN: [id number] Arrival date & time: 12/30/21  1535     History  Chief Complaint  Patient presents with   Hand Injury    Kathy [name] is a 46 y.o. female.   Hand Injury    Patient presents due to hand injury.  Happened while taking out a window, the window did not shatter but she did cut the palm of her right hand.  Unsure last tetanus, denies any paresthesias or numbness.  She is able to move all the digits, the laceration is only to the palm of the hand.  She is not on blood thinners.   Home Medications Prior to Admission medications   Medication Sig Start Date End Date Taking? Authorizing Provider  ondansetron (ZOFRAN-ODT) 8 MG disintegrating tablet Take 1 tablet (8 mg total) by mouth every 8 (eight) hours as needed for nausea or vomiting. 12/30/21  Yes Theron Arista, PA-C  oxyCODONE-acetaminophen (PERCOCET/ROXICET) 5-325 MG tablet Take 1 tablet by mouth every 6 (six) hours as needed for severe pain. 12/30/21  Yes Theron Arista, PA-C  atenolol (TENORMIN) 50 MG tablet TAKE 0.5 TABLET (25 MG TOTAL) BY MOUTH DAILY. 25 MG ON BACKORDER 11/30/21   Carlus Pavlov, MD  ibuprofen (ADVIL,MOTRIN) 200 MG tablet Take 600 mg by mouth every 6 (six) hours as needed.    [provider]      Allergies    Patient has no known allergies.    Review of Systems   Review of Systems  Physical Exam Updated Vital Signs BP (!) 152/107   Pulse 83   Temp 98.5 F (36.9 C)   Resp 18   Ht 5\' 5"  (1.651 m)   Wt 77.1 kg   SpO2 100%   BMI 28.29 kg/m  Physical Exam Vitals and nursing note reviewed. Exam conducted with a chaperone present.  Constitutional:      Appearance: Normal appearance.  HENT:     Head: Normocephalic and atraumatic.  Eyes:     General: No scleral icterus.       Right eye: No discharge.        Left eye: No discharge.     Extraocular Movements: Extraocular movements intact.     Pupils: Pupils are  equal, round, and reactive to light.  Cardiovascular:     Rate and Rhythm: Normal rate and regular rhythm.     Pulses: Normal pulses.     Heart sounds: Normal heart sounds. No murmur heard.    No friction rub. No gallop.  Pulmonary:     Effort: Pulmonary effort is normal. No respiratory distress.     Breath sounds: Normal breath sounds.  Abdominal:     General: Abdomen is flat. Bowel sounds are normal. There is no distension.     Palpations: Abdomen is soft.     Tenderness: There is no abdominal tenderness.  Musculoskeletal:        General: Tenderness present.     Comments: Able to make a complete fist.  Able to flex and extend at the DIP and PIP of each digit.  Thumb abduction abduction intact.  Skin:    General: Skin is warm and dry.     Capillary Refill: Capillary refill takes less than 2 seconds.     Coloration: Skin is not jaundiced.     Comments: 6.2 cm laceration on right palm.  I do not visualize any tendon, there is subcutaneous tissue.  Neurological:  Mental Status: She is alert. Mental status is at baseline.     Coordination: Coordination normal.     Comments: Sensation light touch is circumferentially intact.     ED Results / Procedures / Treatments   Labs (all labs ordered are listed, but only abnormal results are displayed) Labs Reviewed - No data to display  EKG None  Radiology DG Hand Complete Right  Result Date: 12/30/2021 CLINICAL DATA:  Injury to the right hand. Laceration across the palmar surface. EXAM: RIGHT HAND - COMPLETE 3+ VIEW COMPARISON:  None Available. FINDINGS: There is no evidence of fracture or dislocation. There is no evidence of arthropathy or other focal bone abnormality. IMPRESSION: Negative. Electronically Signed   By: Richarda Overlie M.D.   On: 12/30/2021 16:35    Procedures .Marland KitchenLaceration Repair  Date/Time: 12/30/2021 5:21 PM  Performed by: Theron Arista, PA-C Authorized by: Theron Arista, PA-C   Consent:    Consent obtained:  Verbal    Consent given by:  Patient   Risks, benefits, and alternatives were discussed: yes     Risks discussed:  Infection, need for additional repair, pain, nerve damage, poor wound healing, poor cosmetic result, tendon damage and vascular damage   Alternatives discussed:  No treatment, delayed treatment, observation and referral Universal protocol:    Procedure explained and questions answered to patient or proxy's satisfaction: yes     Relevant documents present and verified: yes     Test results available: yes     Imaging studies available: yes     Required blood products, implants, devices, and special equipment available: yes     Site/side marked: yes     Immediately prior to procedure, a time out was called: yes     Patient identity confirmed:  Verbally with patient and arm band Anesthesia:    Anesthesia method:  Local infiltration   Local anesthetic:  Lidocaine 1% w/o epi Laceration details:    Location:  Hand   Hand location:  R palm   Length (cm):  6.2   Depth (mm):  4 Pre-procedure details:    Preparation:  Patient was prepped and draped in usual sterile fashion and imaging obtained to evaluate for foreign bodies Exploration:    Limited defect created (wound extended): no     Hemostasis achieved with:  Direct pressure   Imaging obtained: x-ray     Imaging outcome: foreign body not noted     Wound exploration: wound explored through full range of motion and entire depth of wound visualized     Contaminated: no   Treatment:    Area cleansed with:  Povidone-iodine   Amount of cleaning:  Extensive   Irrigation solution:  Sterile saline   Irrigation volume:  1000 ml   Irrigation method:  Pressure wash   Visualized foreign bodies/material removed: no     Debridement:  None   Undermining:  None   Scar revision: no   Skin repair:    Repair method:  Sutures   Suture size:  4-0   Suture material:  Nylon   Suture technique:  Simple interrupted   Number of sutures:   15 Approximation:    Approximation:  Close Repair type:    Repair type:  Simple Post-procedure details:    Dressing:  Adhesive bandage and non-adherent dressing   Procedure completion:  Tolerated well, no immediate complications     Medications Ordered in ED Medications  lidocaine-EPINEPHrine-tetracaine (LET) topical gel (3 mLs Topical Given 12/30/21 1615)  lidocaine (  PF) (XYLOCAINE) 1 % injection 10 mL (10 mLs Infiltration Given 12/30/21 1615)  ondansetron (ZOFRAN-ODT) disintegrating tablet 8 mg (8 mg Oral Given 12/30/21 1614)  oxyCODONE-acetaminophen (PERCOCET/ROXICET) 5-325 MG per tablet 1 tablet (1 tablet Oral Given 12/30/21 1615)  Tdap (BOOSTRIX) injection 0.5 mL (0.5 mLs Intramuscular Given 12/30/21 1751)    ED Course/ Medical Decision Making/ A&P Clinical Course as of 12/30/21 1841  Fri Dec 30, 2021  1726 DG Hand Complete Right No retained foreign body. [HS]    Clinical Course User Index [HS] Theron Arista, PA-C                           Medical Decision Making Amount and/or Complexity of Data Reviewed Radiology: ordered. Decision-making details documented in ED Course.  Risk Prescription drug management.   Patient presents due to right hand pain.  She is neurovascular intact with brisk cap refill, radial pulses 2+ and has good flexion-extension.  She does have extensive wound about 6 cm to the palm which is basically the entire palm.  There is no bone visualized, no tendon visualized and I do not see any nerves.  I do not think she has a nerve injury based on exam.  I irrigated this very thoroughly, tetanus was updated.  Patient tolerated suture repair without any complication.  Dressing was applied, wound care and follow-up plan discussed with the patient.  Will send home a short course of pain medicine.          Final Clinical Impression(s) / ED Diagnoses Final diagnoses:  Injury of right hand, initial encounter  Laceration of right hand without foreign body,  initial encounter    Rx / DC Orders ED Discharge Orders          Ordered    oxyCODONE-acetaminophen (PERCOCET/ROXICET) 5-325 MG tablet  Every 6 hours PRN        12/30/21 1728    ondansetron (ZOFRAN-ODT) 8 MG disintegrating tablet  Every 8 hours PRN        12/30/21 1728              Theron Arista, PA-C 12/30/21 1841    Gwyneth Sprout, MD 12/30/21 [phone]

## 2021-12-30 NOTE — ED Notes (Signed)
Suture cart at door

## 2022-07-01 LAB — AMB RESULTS CONSOLE CBG: Glucose: 87

## 2022-07-28 NOTE — Progress Notes (Signed)
Pt attended screening event on 07/01/22, where screening result was BP 137/96 and Glucose wnl. At the event, Pt did not share PCP info and no SDOH insecurities was indicated. Per chart review, PCP is listed as Milus Height, PA in [facility]. Pt was seen by PCP on 06/30/22 for Essential Hypertension. Pt has upcoming appointment with Ortho Surg on 08/09/22. Pt was contacted for BP follow up. LMTC. Pt return caller call. During a call, Pt shared her PCP is aware of her BP and is taking medication. No addition health equity team support indicated at this time.

## 2022-11-22 ENCOUNTER — Other Ambulatory Visit: Payer: Self-pay | Admitting: Internal Medicine

## 2023-02-06 ENCOUNTER — Other Ambulatory Visit: Payer: Self-pay | Admitting: Physician Assistant

## 2023-02-06 ENCOUNTER — Ambulatory Visit
Admission: RE | Admit: 2023-02-06 | Discharge: 2023-02-06 | Disposition: A | Discharge status: Home / Self Care | Payer: 59 | Source: Ambulatory Visit | Attending: Physician Assistant | Admitting: Physician Assistant

## 2023-02-06 DIAGNOSIS — M25532 Pain in left wrist: Secondary | ICD-10-CM

## 2023-08-09 ENCOUNTER — Encounter: Payer: Self-pay | Admitting: Obstetrics and Gynecology

## 2023-11-27 LAB — LAB REPORT - SCANNED
Creatinine, POC: 162 mg/dL
EGFR: 107
Free T4: 3.38 ng/dL
Microalb Creat Ratio: 8.7
Microalbumin, Urine: 1.41
TSH: 0.01 — AB (ref 0.41–5.90)

## 2023-11-29 ENCOUNTER — Ambulatory Visit: Payer: Self-pay | Admitting: Internal Medicine

## 2023-11-30 ENCOUNTER — Encounter: Payer: Self-pay | Admitting: Internal Medicine

## 2023-11-30 ENCOUNTER — Ambulatory Visit (INDEPENDENT_AMBULATORY_CARE_PROVIDER_SITE_OTHER): Admitting: Internal Medicine

## 2023-11-30 VITALS — BP 120/70 | HR 98 | Ht 65.0 in | Wt 171.0 lb

## 2023-11-30 DIAGNOSIS — E0500 Thyrotoxicosis with diffuse goiter without thyrotoxic crisis or storm: Secondary | ICD-10-CM

## 2023-11-30 MED ORDER — METHIMAZOLE 5 MG PO TABS: 5.0000 mg | ORAL_TABLET | Freq: Every day | ORAL | 3 refills | Status: DC

## 2023-11-30 NOTE — Patient Instructions (Addendum)
 Please restart methimazole  5 mg daily.  Let's repeat labs in 3-4 weeks.  Please return in 4 months.

## 2023-11-30 NOTE — Progress Notes (Addendum)
 Patient ID: Kathy [name], female   DOB: 10/01/1975, 48 y.o.   MRN: [id number]  HPI  Kathy [name] is a 48 y.o.-year-old female, returning for f/u for Graves ds. Last visit 2 years ago.  Interim history: No tremors, palpitations, heat intolerance, weight loss. She continues to have hot flashes at night.  Reviewed history: Patient was diagnosed with Graves' disease in 2015.   An uptake and scan (08/27/2013) confirmed Graves' disease: 71% uptake (10-30%).  Patient was initially started on MMI 10 mg bid >> then decreased dose to 10 mg in am and 5 mg in pm. On this dose, levels were normal, but she had problems forgetting the doses >> TFTs worsened again >> dose increased to 20 mg daily >> 15 mg daily >> 10 mg daily >> 5 mg daily  in 09/2015.   At last visit, she was on 5 mg daily but was missing doses.  We decreased the dose to 2.5 mg daily. In 05/2018 we were able to stop MMI completely.   She continues atenolol  25 mg daily, which is actually used for hypertension.  Reviewed her TFTs: 11/26/2023: TSH <0.01 (0.34-5.6), free T4 3.38 (0.61-1.12) Lab Results  Component Value Date   TSH 0.01 (A) 11/27/2023   TSH 0.01 Repeated and verified X2. (L) 11/30/2021   TSH 0.80 12/01/2020   TSH 0.66 05/26/2020   TSH 0.81 11/26/2019   TSH 1.11 06/03/2019   TSH 0.64 11/21/2018   TSH 0.78 05/21/2018   TSH 0.70 11/19/2017   TSH 0.82 05/21/2017   FREET4 3.38 11/27/2023   FREET4 0.93 11/30/2021   FREET4 0.93 12/01/2020   FREET4 0.97 05/26/2020   FREET4 0.94 11/26/2019   FREET4 1.08 06/03/2019   FREET4 1.03 11/21/2018   FREET4 0.92 05/21/2018   FREET4 0.78 11/19/2017   FREET4 0.78 05/21/2017   T3FREE 3.5 11/30/2021   T3FREE 2.8 12/01/2020   T3FREE 2.6 05/26/2020   T3FREE 2.7 11/26/2019   T3FREE 3.1 06/03/2019   T3FREE 3.4 11/21/2018   T3FREE 2.9 05/21/2018   T3FREE 2.5 11/19/2017   T3FREE 3.5 05/21/2017   T3FREE 2.7 11/20/2016  03/12/2014: TSH 0.17  07/08/2013: TSH <0.01  (0.34-5.6)  EKG: sinus tachycardia  WBC normal  Her TSI antibodies were high but they normalized at last check: Lab Results  Component Value Date   TSI 116 09/24/2015   TSI 464 (H) 07/10/2013   Of note, on 11/26/2023, AST and ALT were normal.  Pt denies: - feeling nodules in neck - hoarseness - dysphagia - choking  No FH of thyroid  cancer. No h/o radiation tx to head or neck. No recent contrast studies. No herbal supplements. No Biotin use. No recent steroids use.   ROS: + See HPI  I reviewed pt's medications, allergies, PMH, social hx, family hx, and changes were documented in the history of present illness. Otherwise, unchanged from my initial visit note.  Past Medical History:  Diagnosis Date   Hypertension    Hyperthyroidism    Past Surgical History:  Procedure Laterality Date   OVARIAN CYST REMOVAL     TOOTH EXTRACTION Bilateral    Social History   Socioeconomic History   Marital status: Married    Spouse name: Not on file   Number of children: Not on file   Years of education: Not on file   Highest education level: Not on file  Occupational History   Not on file  Tobacco Use   Smoking status: Never   Smokeless tobacco: Never  Substance and Sexual Activity   Alcohol use: No   Drug use: No   Sexual activity: Yes    Partners: Male    Birth control/protection: I.U.D.    Comment: tubal ligation  Other Topics Concern   Not on file  Social History Narrative   Not on file   Social Drivers of Health   Financial Resource Strain: Not on file  Food Insecurity: No Food Insecurity (07/01/2022)   Hunger Vital Sign    Worried About Running Out of Food in the Last Year: Never true    Ran Out of Food in the Last Year: Never true  Transportation Needs: No Transportation Needs (07/01/2022)   PRAPARE - Administrator, Civil Service (Medical): No    Lack of Transportation (Non-Medical): No  Physical Activity: Not on file  Stress: Not on file  Social  Connections: Not on file  Intimate Partner Violence: Not At Risk (07/01/2022)   Humiliation, Afraid, Rape, and Kick questionnaire    Fear of Current or Ex-Partner: No    Emotionally Abused: No    Physically Abused: No    Sexually Abused: No   Current Outpatient Medications on File Prior to Visit  Medication Sig Dispense Refill   atenolol  (TENORMIN ) 50 MG tablet TAKE 0.5 TABLET (25 MG TOTAL) BY MOUTH DAILY. 25 MG ON BACKORDER 45 tablet 3   ibuprofen (ADVIL,MOTRIN) 200 MG tablet Take 600 mg by mouth every 6 (six) hours as needed.     ondansetron  (ZOFRAN -ODT) 8 MG disintegrating tablet Take 1 tablet (8 mg total) by mouth every 8 (eight) hours as needed for nausea or vomiting. 20 tablet 0   oxyCODONE -acetaminophen  (PERCOCET/ROXICET) 5-325 MG tablet Take 1 tablet by mouth every 6 (six) hours as needed for severe pain. 6 tablet 0   No current facility-administered medications on file prior to visit.   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  PE: BP 120/70   Pulse 98   Ht 5' 5 (1.651 m)   Wt 171 lb (77.6 kg)   SpO2 99%   BMI 28.46 kg/m  Pulse 91 on recheck at the end of the visit. Wt Readings from Last 3 Encounters:  11/30/23 171 lb (77.6 kg)  12/30/21 170 lb (77.1 kg)  11/30/21 175 lb (79.4 kg)   Constitutional: normal weight, in NAD Eyes:  EOMI, no exophthalmos ENT: no neck masses, no cervical lymphadenopathy Cardiovascular: tachycardia, RR, No MRG Respiratory: CTA B Musculoskeletal: no deformities Skin:no rashes Neurological: no tremor with outstretched hands  ASSESSMENT:  1. Graves ds.  PLAN:  1. Patient with history of Graves' disease, initially with several thyrotoxic symptoms including heat intolerance, palpitations, anxiety, resolved on methimazole .  She had persistent hot flashes at night for which she started  progesterone. - She has a history of incomplete compliance with methimazole  in the past, forgetting doses, but we were able to decrease the dose and then stop the medication completely in 05/2018.  Unfortunately, at our last visit from 11/2021, her TSH became suppressed again, at 0.01, while the free thyroid  hormones were normal.  As she was asymptomatic, I recommended to return in 4 weeks to repeat her thyroid  test, but I did not suggest to start methimazole  at that time.  She was lost for follow-up afterwards. - She now returns after  2 years, with an undetectable TSH and elevated free T4 checked 4 days ago. - she is asymptomatic, except for hot flashes, which are not new for her and she attributes to perimenopause.  She is tachycardic at today's visit, 98 bpm at the beginning of the visit and 91 bpm at the end of the visit. - At this point, we discussed about restarting methimazole .  We can start at 5 mg daily and repeat the tests in 3 to 4 weeks.  Will add TSI antibodies at that time. - She is on atenolol  but this was used for hypertension so we will defer this to PCP.  Blood pressure is normal today. - I plan to see her back in 3 to 4 months.  Orders Placed This Encounter  Procedures   TSH   T4, free   T3, free   Thyroid  stimulating immunoglobulin   Requested Prescriptions   Signed Prescriptions Disp Refills   methimazole  (TAPAZOLE ) 5 MG tablet 30 tablet 3    Sig: Take 1 tablet (5 mg total) by mouth daily.   Lela Fendt, MD PhD [facility] End[facility]

## 2023-12-03 ENCOUNTER — Ambulatory Visit: Admitting: Internal Medicine

## 2023-12-26 ENCOUNTER — Other Ambulatory Visit

## 2023-12-28 ENCOUNTER — Ambulatory Visit: Payer: Self-pay | Admitting: Internal Medicine

## 2023-12-28 DIAGNOSIS — E0500 Thyrotoxicosis with diffuse goiter without thyrotoxic crisis or storm: Secondary | ICD-10-CM

## 2023-12-28 LAB — T4, FREE: Free T4: 2.6 ng/dL — ABNORMAL HIGH (ref 0.8–1.8)

## 2023-12-28 LAB — T3, FREE: T3, Free: 6.6 pg/mL — ABNORMAL HIGH (ref 2.3–4.2)

## 2023-12-28 LAB — TSH: TSH: 0.01 m[IU]/L — ABNORMAL LOW

## 2023-12-28 LAB — THYROID STIMULATING IMMUNOGLOBULIN: TSI: 272 %{baseline} — ABNORMAL HIGH (ref <140)

## 2023-12-28 MED ORDER — METHIMAZOLE 5 MG PO TABS: 5.0000 mg | ORAL_TABLET | Freq: Two times a day (BID) | ORAL | 3 refills | Status: AC

## 2024-04-07 ENCOUNTER — Ambulatory Visit: Admitting: Internal Medicine
